# Patient Record
Sex: Female | Born: 1975
Health system: Southern US, Community
[De-identification: ages and names within clinical notes are randomized; demographics above are authoritative.]

## PROBLEM LIST (undated history)

## (undated) DIAGNOSIS — F32A Depression, unspecified: Secondary | ICD-10-CM

## (undated) DIAGNOSIS — M5030 Other cervical disc degeneration, unspecified cervical region: Secondary | ICD-10-CM

## (undated) DIAGNOSIS — I10 Essential (primary) hypertension: Secondary | ICD-10-CM

## (undated) DIAGNOSIS — R2681 Unsteadiness on feet: Secondary | ICD-10-CM

## (undated) DIAGNOSIS — R42 Dizziness and giddiness: Secondary | ICD-10-CM

## (undated) DIAGNOSIS — F329 Major depressive disorder, single episode, unspecified: Secondary | ICD-10-CM

## (undated) DIAGNOSIS — R519 Headache, unspecified: Secondary | ICD-10-CM

## (undated) DIAGNOSIS — H5500 Unspecified nystagmus: Secondary | ICD-10-CM

## (undated) DIAGNOSIS — F419 Anxiety disorder, unspecified: Secondary | ICD-10-CM

## (undated) HISTORY — DX: Other cervical disc degeneration, unspecified cervical region: M50.30

## (undated) HISTORY — DX: Unsteadiness on feet: R26.81

## (undated) HISTORY — DX: Unspecified nystagmus: H55.00

## (undated) HISTORY — DX: Dizziness and giddiness: R42

## (undated) HISTORY — DX: Anxiety disorder, unspecified: F41.9

## (undated) HISTORY — DX: Essential (primary) hypertension: I10

## (undated) HISTORY — DX: Headache, unspecified: R51.9

## (undated) HISTORY — DX: Depression, unspecified: F32.A

---
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
## 2000-07-14 ENCOUNTER — Other Ambulatory Visit: Admission: RE | Admit: 2000-07-14 | Discharge: 2000-07-14 | Payer: Self-pay | Admitting: *Deleted

## 2001-09-07 ENCOUNTER — Other Ambulatory Visit: Admission: RE | Admit: 2001-09-07 | Discharge: 2001-09-07 | Payer: Self-pay | Admitting: Obstetrics & Gynecology

## 2002-09-20 ENCOUNTER — Other Ambulatory Visit: Admission: RE | Admit: 2002-09-20 | Discharge: 2002-09-20 | Payer: Self-pay | Admitting: Obstetrics & Gynecology

## 2015-02-18 LAB — OB RESULTS CONSOLE ANTIBODY SCREEN: Antibody Screen: NEGATIVE

## 2015-02-18 LAB — OB RESULTS CONSOLE ABO/RH: RH TYPE: POSITIVE

## 2015-02-18 LAB — OB RESULTS CONSOLE HEPATITIS B SURFACE ANTIGEN: HEP B S AG: NEGATIVE

## 2015-02-18 LAB — OB RESULTS CONSOLE RUBELLA ANTIBODY, IGM: RUBELLA: IMMUNE

## 2015-02-18 LAB — OB RESULTS CONSOLE HIV ANTIBODY (ROUTINE TESTING): HIV: NONREACTIVE

## 2015-02-18 LAB — OB RESULTS CONSOLE GC/CHLAMYDIA
Chlamydia: NEGATIVE
GC PROBE AMP, GENITAL: NEGATIVE

## 2015-02-18 LAB — OB RESULTS CONSOLE RPR: RPR: NONREACTIVE

## 2015-08-14 LAB — OB RESULTS CONSOLE GBS: GBS: NEGATIVE

## 2015-09-08 ENCOUNTER — Encounter (HOSPITAL_COMMUNITY): Payer: Self-pay | Admitting: *Deleted

## 2015-09-08 ENCOUNTER — Inpatient Hospital Stay (HOSPITAL_COMMUNITY): Payer: BLUE CROSS/BLUE SHIELD | Admitting: Anesthesiology

## 2015-09-08 ENCOUNTER — Inpatient Hospital Stay (HOSPITAL_COMMUNITY)
Admission: AD | Admit: 2015-09-08 | Discharge: 2015-09-09 | DRG: 775 | Disposition: A | Discharge status: Home / Self Care | Payer: BLUE CROSS/BLUE SHIELD | Source: Ambulatory Visit | Attending: Obstetrics and Gynecology | Admitting: Obstetrics and Gynecology

## 2015-09-08 DIAGNOSIS — O2243 Hemorrhoids in pregnancy, third trimester: Secondary | ICD-10-CM | POA: Diagnosis present

## 2015-09-08 DIAGNOSIS — Z2882 Immunization not carried out because of caregiver refusal: Secondary | ICD-10-CM | POA: Diagnosis not present

## 2015-09-08 DIAGNOSIS — Z3A38 38 weeks gestation of pregnancy: Secondary | ICD-10-CM | POA: Diagnosis not present

## 2015-09-08 DIAGNOSIS — Z412 Encounter for routine and ritual male circumcision: Secondary | ICD-10-CM | POA: Diagnosis not present

## 2015-09-08 LAB — TYPE AND SCREEN
ABO/RH(D): A POS
Antibody Screen: NEGATIVE

## 2015-09-08 LAB — CBC
HCT: 38.1 % (ref 36.0–46.0)
HEMOGLOBIN: 13.1 g/dL (ref 12.0–15.0)
MCH: 32 pg (ref 26.0–34.0)
MCHC: 34.4 g/dL (ref 30.0–36.0)
MCV: 92.9 fL (ref 78.0–100.0)
PLATELETS: 267 10*3/uL (ref 150–400)
RBC: 4.1 MIL/uL (ref 3.87–5.11)
RDW: 16 % — AB (ref 11.5–15.5)
WBC: 14.2 10*3/uL — ABNORMAL HIGH (ref 4.0–10.5)

## 2015-09-08 LAB — ABO/RH: ABO/RH(D): A POS

## 2015-09-08 MED ORDER — TETANUS-DIPHTH-ACELL PERTUSSIS 5-2.5-18.5 LF-MCG/0.5 IM SUSP
0.5000 mL | Freq: Once | INTRAMUSCULAR | Status: DC
Start: 2015-09-09 — End: 2015-09-09

## 2015-09-08 MED ORDER — DIBUCAINE 1 % RE OINT: 1.0000 "application " | TOPICAL_OINTMENT | RECTAL | Status: DC | PRN

## 2015-09-08 MED ORDER — PHENYLEPHRINE 40 MCG/ML (10ML) SYRINGE FOR IV PUSH (FOR BLOOD PRESSURE SUPPORT)
80.0000 ug | PREFILLED_SYRINGE | INTRAVENOUS | Status: DC | PRN
  Filled 2015-09-08: qty 2

## 2015-09-08 MED ORDER — LACTATED RINGERS IV SOLN: 500.0000 mL | INTRAVENOUS | Status: DC | PRN

## 2015-09-08 MED ORDER — LACTATED RINGERS IV SOLN
500.0000 mL | Freq: Once | INTRAVENOUS | Status: AC
  Administered 2015-09-08: 500 mL via INTRAVENOUS

## 2015-09-08 MED ORDER — EPHEDRINE 5 MG/ML INJ
10.0000 mg | INTRAVENOUS | Status: DC | PRN
  Filled 2015-09-08: qty 2

## 2015-09-08 MED ORDER — ACETAMINOPHEN 325 MG PO TABS: 650.0000 mg | ORAL_TABLET | ORAL | Status: DC | PRN

## 2015-09-08 MED ORDER — WITCH HAZEL-GLYCERIN EX PADS: 1.0000 "application " | MEDICATED_PAD | CUTANEOUS | Status: DC | PRN

## 2015-09-08 MED ORDER — OXYTOCIN BOLUS FROM INFUSION: 500.0000 mL | INTRAVENOUS | Status: DC

## 2015-09-08 MED ORDER — SIMETHICONE 80 MG PO CHEW: 80.0000 mg | CHEWABLE_TABLET | ORAL | Status: DC | PRN

## 2015-09-08 MED ORDER — LIDOCAINE HCL (PF) 1 % IJ SOLN
INTRAMUSCULAR | Status: DC | PRN
  Administered 2015-09-08 (×2): 5 mL

## 2015-09-08 MED ORDER — ONDANSETRON HCL 4 MG/2ML IJ SOLN: 4.0000 mg | INTRAMUSCULAR | Status: DC | PRN

## 2015-09-08 MED ORDER — DIPHENHYDRAMINE HCL 25 MG PO CAPS: 25.0000 mg | ORAL_CAPSULE | Freq: Four times a day (QID) | ORAL | Status: DC | PRN

## 2015-09-08 MED ORDER — LACTATED RINGERS IV SOLN: INTRAVENOUS | Status: DC

## 2015-09-08 MED ORDER — IBUPROFEN 600 MG PO TABS
600.0000 mg | ORAL_TABLET | Freq: Four times a day (QID) | ORAL | Status: DC
  Administered 2015-09-08 – 2015-09-09 (×4): 600 mg via ORAL
  Filled 2015-09-08 (×5): qty 1

## 2015-09-08 MED ORDER — MEASLES, MUMPS & RUBELLA VAC ~~LOC~~ INJ: 0.5000 mL | INJECTION | Freq: Once | SUBCUTANEOUS | Status: DC

## 2015-09-08 MED ORDER — MEDROXYPROGESTERONE ACETATE 150 MG/ML IM SUSP: 150.0000 mg | INTRAMUSCULAR | Status: DC | PRN

## 2015-09-08 MED ORDER — OXYTOCIN 10 UNIT/ML IJ SOLN
2.5000 [IU]/h | INTRAVENOUS | Status: DC
  Administered 2015-09-08: 39.96 [IU]/h via INTRAVENOUS
  Filled 2015-09-08: qty 4

## 2015-09-08 MED ORDER — BENZOCAINE-MENTHOL 20-0.5 % EX AERO
1.0000 "application " | INHALATION_SPRAY | CUTANEOUS | Status: DC | PRN
  Administered 2015-09-08: 1 via TOPICAL
  Filled 2015-09-08: qty 56

## 2015-09-08 MED ORDER — ONDANSETRON HCL 4 MG PO TABS: 4.0000 mg | ORAL_TABLET | ORAL | Status: DC | PRN

## 2015-09-08 MED ORDER — OXYCODONE-ACETAMINOPHEN 5-325 MG PO TABS
2.0000 | ORAL_TABLET | ORAL | Status: DC | PRN
Start: 2015-09-08 — End: 2015-09-08

## 2015-09-08 MED ORDER — OXYCODONE-ACETAMINOPHEN 5-325 MG PO TABS: 1.0000 | ORAL_TABLET | ORAL | Status: DC | PRN

## 2015-09-08 MED ORDER — ZOLPIDEM TARTRATE 5 MG PO TABS: 5.0000 mg | ORAL_TABLET | Freq: Every evening | ORAL | Status: DC | PRN

## 2015-09-08 MED ORDER — DIPHENHYDRAMINE HCL 50 MG/ML IJ SOLN
12.5000 mg | INTRAMUSCULAR | Status: DC | PRN
Start: 2015-09-08 — End: 2015-09-08

## 2015-09-08 MED ORDER — CITRIC ACID-SODIUM CITRATE 334-500 MG/5ML PO SOLN: 30.0000 mL | ORAL | Status: DC | PRN

## 2015-09-08 MED ORDER — PHENYLEPHRINE 40 MCG/ML (10ML) SYRINGE FOR IV PUSH (FOR BLOOD PRESSURE SUPPORT)
80.0000 ug | PREFILLED_SYRINGE | INTRAVENOUS | Status: DC | PRN
  Filled 2015-09-08: qty 20
  Filled 2015-09-08: qty 2

## 2015-09-08 MED ORDER — LANOLIN HYDROUS EX OINT: TOPICAL_OINTMENT | CUTANEOUS | Status: DC | PRN

## 2015-09-08 MED ORDER — FLEET ENEMA 7-19 GM/118ML RE ENEM: 1.0000 | ENEMA | RECTAL | Status: DC | PRN

## 2015-09-08 MED ORDER — PRENATAL MULTIVITAMIN CH
1.0000 | ORAL_TABLET | Freq: Every day | ORAL | Status: DC
  Administered 2015-09-08 – 2015-09-09 (×2): 1 via ORAL
  Filled 2015-09-08 (×2): qty 1

## 2015-09-08 MED ORDER — SENNOSIDES-DOCUSATE SODIUM 8.6-50 MG PO TABS
2.0000 | ORAL_TABLET | ORAL | Status: DC
  Administered 2015-09-08: 2 via ORAL
  Filled 2015-09-08: qty 2

## 2015-09-08 MED ORDER — LIDOCAINE HCL (PF) 1 % IJ SOLN
30.0000 mL | INTRAMUSCULAR | Status: DC | PRN
  Filled 2015-09-08: qty 30

## 2015-09-08 MED ORDER — FENTANYL 2.5 MCG/ML BUPIVACAINE 1/10 % EPIDURAL INFUSION (WH - ANES)
14.0000 mL/h | INTRAMUSCULAR | Status: DC | PRN
Start: 2015-09-08 — End: 2015-09-08
  Administered 2015-09-08: 14 mL/h via EPIDURAL
  Filled 2015-09-08: qty 125

## 2015-09-08 MED ORDER — ONDANSETRON HCL 4 MG/2ML IJ SOLN: 4.0000 mg | Freq: Four times a day (QID) | INTRAMUSCULAR | Status: DC | PRN

## 2015-09-08 NOTE — Anesthesia Preprocedure Evaluation (Signed)
Anesthesia Evaluation  Patient identified by MRN, date of birth, ID band Patient awake    Reviewed: Allergy & Precautions, H&P , NPO status , Patient's Chart, lab work & pertinent test results  History of Anesthesia Complications Negative for: history of anesthetic complications  Airway Mallampati: II  TM Distance: >3 FB Neck ROM: full    Dental no notable dental hx. (+) Teeth Intact   Pulmonary neg pulmonary ROS,    Pulmonary exam normal breath sounds clear to auscultation       Cardiovascular negative cardio ROS Normal cardiovascular exam Rhythm:regular Rate:Normal     Neuro/Psych negative neurological ROS  negative psych ROS   GI/Hepatic negative GI ROS, Neg liver ROS,   Endo/Other  negative endocrine ROS  Renal/GU negative Renal ROS  negative genitourinary   Musculoskeletal negative musculoskeletal ROS (+)   Abdominal   Peds negative pediatric ROS (+)  Hematology negative hematology ROS (+)   Anesthesia Other Findings   Reproductive/Obstetrics (+) Pregnancy                             Anesthesia Physical Anesthesia Plan  ASA: II  Anesthesia Plan: Epidural   Post-op Pain Management:    Induction:   Airway Management Planned:   Additional Equipment:   Intra-op Plan:   Post-operative Plan:   Informed Consent: I have reviewed the patients History and Physical, chart, labs and discussed the procedure including the risks, benefits and alternatives for the proposed anesthesia with the patient or authorized representative who has indicated his/her understanding and acceptance.     Plan Discussed with:   Anesthesia Plan Comments:         Anesthesia Quick Evaluation  

## 2015-09-08 NOTE — H&P (Signed)
Erika Duffy is a 40 y.o. female G3P2 @ 38+5 presenting for SOL.  Pregnancy uncomplicated.  History OB History    Gravida Para Term Preterm AB TAB SAB Ectopic Multiple Living   3 2        2      History reviewed. No pertinent past medical history. History reviewed. No pertinent past surgical history. Family History: family history is not on file. Social History:  reports that she has never smoked. She does not have any smokeless tobacco history on file. She reports that she does not drink alcohol or use illicit drugs.   Prenatal Transfer Tool  Maternal Diabetes: No Genetic Screening: Declined Maternal Ultrasounds/Referrals: Normal Fetal Ultrasounds or other Referrals:  None Maternal Substance Abuse:  No Significant Maternal Medications:  None Significant Maternal Lab Results:  None Other Comments:  None  ROS  Dilation: 10 Effacement (%): 100 Station: +2 Exam by:: M.Merrill, RN Blood pressure 167/93, pulse 86, temperature 97.8 F (36.6 C), temperature source Axillary, resp. rate 18, height 5\' 8"  (1.727 m), weight 263 lb (119.296 kg), SpO2 98 %. Exam Physical Exam  Gen - uncomfortable w/ epidural Abd - gravid, NT Ext - no edema Cvx 5.5 cm on admission Prenatal labs: ABO, Rh: --/--/A POS (04/03 0330) Antibody: NEG (04/03 0330) Rubella: Immune (09/13 0000) RPR: Nonreactive (09/13 0000)  HBsAg: Negative (09/13 0000)  HIV: Non-reactive (09/13 0000)  GBS: Negative (03/09 0000)   Assessment/Plan: Admit Epidural prn   Erika Duffy 09/08/2015, 6:37 AM

## 2015-09-08 NOTE — Progress Notes (Signed)
SVD of vigerous female infant w/ apgars of 9,9. Placenta delivered spontaneous w/ 3VC.   2nd degree lac repaired w/ 3-0 vicryl rapide.  Fundus firm.  EBL 100cc .

## 2015-09-08 NOTE — Lactation Note (Signed)
This note was copied from a baby's chart. Lactation Consultation Note  P3, Mother breastfed both children for 6 weeks and then pumped for 6 months with 2nd child. Mother would like to only pump and bottle feed this child and supplement w/ formula. Reviewed volume guidelines and spoon feeding with parents. Set up DEBP.  Recommend mother pump every 3 hours with the exception of once during the night. Reviewed cleaning and milk storage. Mother has her own DEBP and asked LC about the brand.  Unable to recommend or not. Suggest she research before opening box.  Provided paperwork for 2 week rental if pump is available. Mother pumped 5 ml which she will give to baby upon wakening via bottle. Suggest she call if further assistance is needed.   Patient Name: Erika Duffy SeMegan Torok UJWJX'BToday's Date: 09/08/2015 Reason for consult: Initial assessment   Maternal Data Has patient been taught Hand Expression?: Yes Does the patient have breastfeeding experience prior to this delivery?: Yes  Feeding    LATCH Score/Interventions                      Lactation Tools Discussed/Used Pump Review: Setup, frequency, and cleaning;Milk Storage Initiated by:: Dahlia Byesuth Berkelhammer Date initiated:: 09/08/15   Consult Status Consult Status: PRN Date: 09/09/15 Follow-up type: In-patient    Dahlia ByesBerkelhammer, [facility] 09/08/2015, 1:32 PM

## 2015-09-08 NOTE — MAU Note (Signed)
Pt reports contractions, ROM at 0100.

## 2015-09-08 NOTE — Anesthesia Procedure Notes (Signed)
Epidural Patient location during procedure: OB  Staffing Anesthesiologist: Phillips GroutARIGNAN, Ladelle Teodoro Performed by: anesthesiologist   Preanesthetic Checklist Completed: patient identified, site marked, surgical consent, pre-op evaluation, timeout performed, IV checked, risks and benefits discussed and monitors and equipment checked  Epidural Patient position: sitting Prep: DuraPrep Patient monitoring: heart rate, continuous pulse ox and blood pressure Approach: midline Location: L3-L4 Injection technique: LOR saline  Needle:  Needle type: Tuohy  Needle gauge: 17 G Needle length: 9 cm and 9 Needle insertion depth: 9 cm Catheter type: closed end flexible Catheter size: 20 Guage Catheter at skin depth: 13 cm Test dose: negative  Assessment Events: blood not aspirated, injection not painful, no injection resistance, negative IV test and no paresthesia  Additional Notes Patient identified. Risks/Benefits/Options discussed with patient including but not limited to bleeding, infection, nerve damage, paralysis, failed block, incomplete pain control, headache, blood pressure changes, nausea, vomiting, reactions to medication both or allergic, itching and postpartum back pain. Confirmed with bedside nurse the patient's most recent platelet count. Confirmed with patient that they are not currently taking any anticoagulation, have any bleeding history or any family history of bleeding disorders. Patient expressed understanding and wished to proceed. All questions were answered. Sterile technique was used throughout the entire procedure. Please see nursing notes for vital signs. Test dose was given through epidural needle and negative prior to continuing to dose epidural or start infusion. Warning signs of high block given to the patient including shortness of breath, tingling/numbness in hands, complete motor block, or any concerning symptoms with instructions to call for help. Patient was given  instructions on fall risk and not to get out of bed. All questions and concerns addressed with instructions to call with any issues.

## 2015-09-09 LAB — CBC
HEMATOCRIT: 33.7 % — AB (ref 36.0–46.0)
Hemoglobin: 11.2 g/dL — ABNORMAL LOW (ref 12.0–15.0)
MCH: 31.2 pg (ref 26.0–34.0)
MCHC: 33.2 g/dL (ref 30.0–36.0)
MCV: 93.9 fL (ref 78.0–100.0)
Platelets: 225 10*3/uL (ref 150–400)
RBC: 3.59 MIL/uL — ABNORMAL LOW (ref 3.87–5.11)
RDW: 16.5 % — AB (ref 11.5–15.5)
WBC: 12.6 10*3/uL — AB (ref 4.0–10.5)

## 2015-09-09 LAB — COMPREHENSIVE METABOLIC PANEL
ALT: 16 U/L (ref 14–54)
ANION GAP: 8 (ref 5–15)
AST: 25 U/L (ref 15–41)
Albumin: 2.5 g/dL — ABNORMAL LOW (ref 3.5–5.0)
Alkaline Phosphatase: 74 U/L (ref 38–126)
BILIRUBIN TOTAL: 0.2 mg/dL — AB (ref 0.3–1.2)
BUN: 17 mg/dL (ref 6–20)
CHLORIDE: 108 mmol/L (ref 101–111)
CO2: 24 mmol/L (ref 22–32)
Calcium: 8.6 mg/dL — ABNORMAL LOW (ref 8.9–10.3)
Creatinine, Ser: 0.82 mg/dL (ref 0.44–1.00)
Glucose, Bld: 87 mg/dL (ref 65–99)
POTASSIUM: 4.2 mmol/L (ref 3.5–5.1)
Sodium: 140 mmol/L (ref 135–145)
TOTAL PROTEIN: 5.7 g/dL — AB (ref 6.5–8.1)

## 2015-09-09 LAB — RPR: RPR: NONREACTIVE

## 2015-09-09 MED ORDER — IBUPROFEN 600 MG PO TABS: 600.0000 mg | ORAL_TABLET | Freq: Four times a day (QID) | ORAL | Status: DC

## 2015-09-09 NOTE — Progress Notes (Signed)
Post Partum Day 1 Subjective: no complaints, up ad lib, voiding, tolerating PO and + flatus  Objective: Blood pressure 132/76, pulse 86, temperature 97.8 F (36.6 C), temperature source Oral, resp. rate 20, height 5\' 8"  (1.727 m), weight 263 lb (119.296 kg), SpO2 99 %, unknown if currently breastfeeding.  Physical Exam:  General: alert and cooperative Lochia: appropriate Uterine Fundus: firm Incision: healing well, small non thrombosed hemorrhoids noted DVT Evaluation: No evidence of DVT seen on physical exam. Negative Homan's sign. No cords or calf tenderness. No significant calf/ankle edema.   Recent Labs  09/08/15 0330 09/09/15 0512  HGB 13.1 11.2*  HCT 38.1 33.7*    Assessment/Plan: Discharge home and Circumcision prior to discharge   LOS: 1 day   Laken Rog G 09/09/2015, 8:28 AM

## 2015-09-09 NOTE — Discharge Summary (Signed)
Obstetric Discharge Summary Reason for Admission: onset of labor Prenatal Procedures: ultrasound Intrapartum Procedures: spontaneous vaginal delivery Postpartum Procedures: none Complications-Operative and Postpartum: 2 degree perineal laceration HEMOGLOBIN  Date Value Ref Range Status  09/09/2015 11.2* 12.0 - 15.0 Duffy/dL Final   HCT  Date Value Ref Range Status  09/09/2015 33.7* 36.0 - 46.0 % Final    Physical Exam:  General: alert and cooperative Lochia: appropriate Uterine Fundus: firm Incision: healing well DVT Evaluation: No evidence of DVT seen on physical exam. Negative Homan's sign. No cords or calf tenderness. No significant calf/ankle edema.  Discharge Diagnoses: Term Pregnancy-delivered  Discharge Information: Date: 09/09/2015 Activity: pelvic rest Diet: routine Medications: PNV, Ibuprofen and zoloft Condition: stable Instructions: refer to practice specific booklet Discharge to: home   Newborn Data: Live born female  Birth Weight: 7 lb 13.2 oz (3550 Duffy) APGAR: 9, 9  Home with mother.  Erika Duffy 09/09/2015, 8:37 AM

## 2015-09-09 NOTE — Lactation Note (Signed)
This note was copied from a baby's chart. Lactation Consultation Note  Patient Name: Boy Concha SeMegan Castoro AVWUJ'WToday's Date: 09/09/2015 Reason for consult: Follow-up assessment   Follow up with mom of 26 hour old infant. Mom plans to pump and bottle feed EBM and supplement with formula. Infant with 10 bottle feeds of Formula of 8-40 cc, 5 EBM via bottle of 5 cc. Mom is pumping every 3 hours and supplementing infant. Infant with 6 voids and 3 stools in last 24 hours.  Reviewed BF information in Taking Care of Baby and Me Booklet. Engorgement prevention/treatment reviewed. Mom without c/o engorgement a this time.  I/O reviewed with mom, mom maintaining feeding log and enc to take to Ped visit. Infant with f/u ped appt tomorrow.   Reviewed LC Brochure, Mom aware of OP Services, BF Support Groups and LC Phone #. Enc mom to call with questions/concerns prn.   Maternal Data Formula Feeding for Exclusion: Yes  Feeding Feeding Type: Formula Nipple Type: Slow - flow  LATCH Score/Interventions                      Lactation Tools Discussed/Used Pump Review: Setup, frequency, and cleaning;Milk Storage   Consult Status Consult Status: Complete Follow-up type: Call as needed    Ed BlalockSharon S Deaisha Welborn 09/09/2015, 8:52 AM

## 2015-09-09 NOTE — Anesthesia Postprocedure Evaluation (Signed)
Anesthesia Post Note  Patient: Erika Duffy  Procedure(s) Performed: * No procedures listed *  Patient location during evaluation: Mother Baby Anesthesia Type: Epidural Level of consciousness: awake, awake and alert, oriented and patient cooperative Pain management: pain level controlled Vital Signs Assessment: post-procedure vital signs reviewed and stable Respiratory status: spontaneous breathing Cardiovascular status: stable Postop Assessment: no headache, no backache, epidural receding, no signs of nausea or vomiting and adequate PO intake Anesthetic complications: no    Last Vitals:  Filed Vitals:   09/08/15 2115 09/09/15 0540  BP: 146/86 132/76  Pulse: 91 86  Temp: 36.9 C 36.6 C  Resp: 20 20    Last Pain:  Filed Vitals:   09/09/15 0554  PainSc: 1                  Evan Osburn C

## 2015-09-12 ENCOUNTER — Inpatient Hospital Stay (HOSPITAL_COMMUNITY): Admission: RE | Admit: 2015-09-12 | Payer: Self-pay | Source: Ambulatory Visit

## 2015-10-17 DIAGNOSIS — Z1389 Encounter for screening for other disorder: Secondary | ICD-10-CM | POA: Diagnosis not present

## 2016-03-29 DIAGNOSIS — Z23 Encounter for immunization: Secondary | ICD-10-CM | POA: Diagnosis not present

## 2016-07-29 DIAGNOSIS — L719 Rosacea, unspecified: Secondary | ICD-10-CM | POA: Diagnosis not present

## 2016-07-29 DIAGNOSIS — L858 Other specified epidermal thickening: Secondary | ICD-10-CM | POA: Diagnosis not present

## 2016-08-19 DIAGNOSIS — I10 Essential (primary) hypertension: Secondary | ICD-10-CM | POA: Diagnosis not present

## 2016-08-19 DIAGNOSIS — F325 Major depressive disorder, single episode, in full remission: Secondary | ICD-10-CM | POA: Diagnosis not present

## 2016-08-19 DIAGNOSIS — F419 Anxiety disorder, unspecified: Secondary | ICD-10-CM | POA: Diagnosis not present

## 2016-12-23 DIAGNOSIS — L858 Other specified epidermal thickening: Secondary | ICD-10-CM | POA: Diagnosis not present

## 2016-12-23 DIAGNOSIS — L700 Acne vulgaris: Secondary | ICD-10-CM | POA: Diagnosis not present

## 2017-01-27 DIAGNOSIS — L858 Other specified epidermal thickening: Secondary | ICD-10-CM | POA: Diagnosis not present

## 2017-02-24 DIAGNOSIS — L700 Acne vulgaris: Secondary | ICD-10-CM | POA: Diagnosis not present

## 2017-02-24 DIAGNOSIS — Z79899 Other long term (current) drug therapy: Secondary | ICD-10-CM | POA: Diagnosis not present

## 2017-03-29 DIAGNOSIS — L858 Other specified epidermal thickening: Secondary | ICD-10-CM | POA: Diagnosis not present

## 2017-03-29 DIAGNOSIS — L700 Acne vulgaris: Secondary | ICD-10-CM | POA: Diagnosis not present

## 2017-03-29 DIAGNOSIS — Z79899 Other long term (current) drug therapy: Secondary | ICD-10-CM | POA: Diagnosis not present

## 2017-03-30 DIAGNOSIS — Z23 Encounter for immunization: Secondary | ICD-10-CM | POA: Diagnosis not present

## 2017-05-03 DIAGNOSIS — L700 Acne vulgaris: Secondary | ICD-10-CM | POA: Diagnosis not present

## 2017-05-03 DIAGNOSIS — L858 Other specified epidermal thickening: Secondary | ICD-10-CM | POA: Diagnosis not present

## 2017-05-03 DIAGNOSIS — Z79899 Other long term (current) drug therapy: Secondary | ICD-10-CM | POA: Diagnosis not present

## 2017-06-06 DIAGNOSIS — L700 Acne vulgaris: Secondary | ICD-10-CM | POA: Diagnosis not present

## 2017-06-06 DIAGNOSIS — Z79899 Other long term (current) drug therapy: Secondary | ICD-10-CM | POA: Diagnosis not present

## 2017-06-09 DIAGNOSIS — L858 Other specified epidermal thickening: Secondary | ICD-10-CM | POA: Diagnosis not present

## 2017-06-09 DIAGNOSIS — L738 Other specified follicular disorders: Secondary | ICD-10-CM | POA: Diagnosis not present

## 2017-07-07 DIAGNOSIS — Z79899 Other long term (current) drug therapy: Secondary | ICD-10-CM | POA: Diagnosis not present

## 2017-07-07 DIAGNOSIS — L700 Acne vulgaris: Secondary | ICD-10-CM | POA: Diagnosis not present

## 2017-07-07 DIAGNOSIS — L858 Other specified epidermal thickening: Secondary | ICD-10-CM | POA: Diagnosis not present

## 2017-08-04 DIAGNOSIS — L858 Other specified epidermal thickening: Secondary | ICD-10-CM | POA: Diagnosis not present

## 2017-08-18 DIAGNOSIS — M722 Plantar fascial fibromatosis: Secondary | ICD-10-CM | POA: Diagnosis not present

## 2017-08-18 DIAGNOSIS — S99922A Unspecified injury of left foot, initial encounter: Secondary | ICD-10-CM | POA: Diagnosis not present

## 2017-08-18 DIAGNOSIS — M6701 Short Achilles tendon (acquired), right ankle: Secondary | ICD-10-CM | POA: Diagnosis not present

## 2017-09-22 DIAGNOSIS — N951 Menopausal and female climacteric states: Secondary | ICD-10-CM | POA: Diagnosis not present

## 2017-09-22 DIAGNOSIS — R61 Generalized hyperhidrosis: Secondary | ICD-10-CM | POA: Diagnosis not present

## 2017-10-13 DIAGNOSIS — R232 Flushing: Secondary | ICD-10-CM | POA: Diagnosis not present

## 2017-10-13 DIAGNOSIS — F419 Anxiety disorder, unspecified: Secondary | ICD-10-CM | POA: Diagnosis not present

## 2017-10-13 DIAGNOSIS — I10 Essential (primary) hypertension: Secondary | ICD-10-CM | POA: Diagnosis not present

## 2017-10-13 DIAGNOSIS — F325 Major depressive disorder, single episode, in full remission: Secondary | ICD-10-CM | POA: Diagnosis not present

## 2017-11-10 DIAGNOSIS — L700 Acne vulgaris: Secondary | ICD-10-CM | POA: Diagnosis not present

## 2017-11-10 DIAGNOSIS — Z79899 Other long term (current) drug therapy: Secondary | ICD-10-CM | POA: Diagnosis not present

## 2017-11-17 DIAGNOSIS — Z6835 Body mass index (BMI) 35.0-35.9, adult: Secondary | ICD-10-CM | POA: Diagnosis not present

## 2017-11-17 DIAGNOSIS — Z01419 Encounter for gynecological examination (general) (routine) without abnormal findings: Secondary | ICD-10-CM | POA: Diagnosis not present

## 2017-11-17 DIAGNOSIS — Z1212 Encounter for screening for malignant neoplasm of rectum: Secondary | ICD-10-CM | POA: Diagnosis not present

## 2017-11-17 DIAGNOSIS — Z800 Family history of malignant neoplasm of digestive organs: Secondary | ICD-10-CM | POA: Diagnosis not present

## 2017-11-17 DIAGNOSIS — N816 Rectocele: Secondary | ICD-10-CM | POA: Diagnosis not present

## 2017-11-17 DIAGNOSIS — Z8042 Family history of malignant neoplasm of prostate: Secondary | ICD-10-CM | POA: Diagnosis not present

## 2017-11-17 DIAGNOSIS — Z803 Family history of malignant neoplasm of breast: Secondary | ICD-10-CM | POA: Diagnosis not present

## 2017-12-01 DIAGNOSIS — N8110 Cystocele, unspecified: Secondary | ICD-10-CM | POA: Diagnosis not present

## 2017-12-01 DIAGNOSIS — M6281 Muscle weakness (generalized): Secondary | ICD-10-CM | POA: Diagnosis not present

## 2017-12-01 DIAGNOSIS — N816 Rectocele: Secondary | ICD-10-CM | POA: Diagnosis not present

## 2017-12-29 DIAGNOSIS — Z809 Family history of malignant neoplasm, unspecified: Secondary | ICD-10-CM | POA: Diagnosis not present

## 2017-12-29 DIAGNOSIS — Z1231 Encounter for screening mammogram for malignant neoplasm of breast: Secondary | ICD-10-CM | POA: Diagnosis not present

## 2018-01-19 DIAGNOSIS — N819 Female genital prolapse, unspecified: Secondary | ICD-10-CM | POA: Diagnosis not present

## 2018-01-19 DIAGNOSIS — M6281 Muscle weakness (generalized): Secondary | ICD-10-CM | POA: Diagnosis not present

## 2018-01-31 DIAGNOSIS — N8110 Cystocele, unspecified: Secondary | ICD-10-CM | POA: Diagnosis not present

## 2018-01-31 DIAGNOSIS — N393 Stress incontinence (female) (male): Secondary | ICD-10-CM | POA: Diagnosis not present

## 2018-01-31 DIAGNOSIS — N816 Rectocele: Secondary | ICD-10-CM | POA: Diagnosis not present

## 2018-01-31 DIAGNOSIS — M6281 Muscle weakness (generalized): Secondary | ICD-10-CM | POA: Diagnosis not present

## 2018-02-21 DIAGNOSIS — N8110 Cystocele, unspecified: Secondary | ICD-10-CM | POA: Diagnosis not present

## 2018-02-21 DIAGNOSIS — M6281 Muscle weakness (generalized): Secondary | ICD-10-CM | POA: Diagnosis not present

## 2018-02-21 DIAGNOSIS — N816 Rectocele: Secondary | ICD-10-CM | POA: Diagnosis not present

## 2018-03-14 DIAGNOSIS — N8110 Cystocele, unspecified: Secondary | ICD-10-CM | POA: Diagnosis not present

## 2018-03-14 DIAGNOSIS — N816 Rectocele: Secondary | ICD-10-CM | POA: Diagnosis not present

## 2018-03-14 DIAGNOSIS — M6281 Muscle weakness (generalized): Secondary | ICD-10-CM | POA: Diagnosis not present

## 2018-03-24 DIAGNOSIS — Z23 Encounter for immunization: Secondary | ICD-10-CM | POA: Diagnosis not present

## 2018-03-28 DIAGNOSIS — E669 Obesity, unspecified: Secondary | ICD-10-CM | POA: Diagnosis not present

## 2018-03-28 DIAGNOSIS — Z6834 Body mass index (BMI) 34.0-34.9, adult: Secondary | ICD-10-CM | POA: Diagnosis not present

## 2018-03-28 DIAGNOSIS — R5383 Other fatigue: Secondary | ICD-10-CM | POA: Diagnosis not present

## 2018-04-04 DIAGNOSIS — M6281 Muscle weakness (generalized): Secondary | ICD-10-CM | POA: Diagnosis not present

## 2018-04-04 DIAGNOSIS — N816 Rectocele: Secondary | ICD-10-CM | POA: Diagnosis not present

## 2018-04-04 DIAGNOSIS — N8110 Cystocele, unspecified: Secondary | ICD-10-CM | POA: Diagnosis not present

## 2018-04-18 DIAGNOSIS — M6281 Muscle weakness (generalized): Secondary | ICD-10-CM | POA: Diagnosis not present

## 2018-04-18 DIAGNOSIS — N819 Female genital prolapse, unspecified: Secondary | ICD-10-CM | POA: Diagnosis not present

## 2018-04-25 DIAGNOSIS — E669 Obesity, unspecified: Secondary | ICD-10-CM | POA: Diagnosis not present

## 2018-04-25 DIAGNOSIS — I10 Essential (primary) hypertension: Secondary | ICD-10-CM | POA: Diagnosis not present

## 2018-04-25 DIAGNOSIS — Z6833 Body mass index (BMI) 33.0-33.9, adult: Secondary | ICD-10-CM | POA: Diagnosis not present

## 2018-05-02 DIAGNOSIS — N816 Rectocele: Secondary | ICD-10-CM | POA: Diagnosis not present

## 2018-05-02 DIAGNOSIS — M6281 Muscle weakness (generalized): Secondary | ICD-10-CM | POA: Diagnosis not present

## 2018-05-02 DIAGNOSIS — N8110 Cystocele, unspecified: Secondary | ICD-10-CM | POA: Diagnosis not present

## 2018-06-13 DIAGNOSIS — N951 Menopausal and female climacteric states: Secondary | ICD-10-CM | POA: Diagnosis not present

## 2018-06-13 DIAGNOSIS — E669 Obesity, unspecified: Secondary | ICD-10-CM | POA: Diagnosis not present

## 2018-06-13 DIAGNOSIS — Z6832 Body mass index (BMI) 32.0-32.9, adult: Secondary | ICD-10-CM | POA: Diagnosis not present

## 2018-06-13 DIAGNOSIS — I10 Essential (primary) hypertension: Secondary | ICD-10-CM | POA: Diagnosis not present

## 2018-10-04 DIAGNOSIS — Z6831 Body mass index (BMI) 31.0-31.9, adult: Secondary | ICD-10-CM | POA: Diagnosis not present

## 2018-10-04 DIAGNOSIS — H5500 Unspecified nystagmus: Secondary | ICD-10-CM | POA: Diagnosis not present

## 2018-10-04 DIAGNOSIS — R42 Dizziness and giddiness: Secondary | ICD-10-CM | POA: Diagnosis not present

## 2018-10-04 DIAGNOSIS — I10 Essential (primary) hypertension: Secondary | ICD-10-CM | POA: Diagnosis not present

## 2018-10-09 DIAGNOSIS — R42 Dizziness and giddiness: Secondary | ICD-10-CM | POA: Diagnosis not present

## 2018-10-09 DIAGNOSIS — Z6831 Body mass index (BMI) 31.0-31.9, adult: Secondary | ICD-10-CM | POA: Diagnosis not present

## 2018-10-09 DIAGNOSIS — H5500 Unspecified nystagmus: Secondary | ICD-10-CM | POA: Diagnosis not present

## 2018-10-09 DIAGNOSIS — M4802 Spinal stenosis, cervical region: Secondary | ICD-10-CM | POA: Diagnosis not present

## 2018-10-09 DIAGNOSIS — M545 Low back pain: Secondary | ICD-10-CM | POA: Diagnosis not present

## 2018-10-09 DIAGNOSIS — M542 Cervicalgia: Secondary | ICD-10-CM | POA: Diagnosis not present

## 2018-10-09 DIAGNOSIS — M549 Dorsalgia, unspecified: Secondary | ICD-10-CM | POA: Diagnosis not present

## 2018-10-09 DIAGNOSIS — M50323 Other cervical disc degeneration at C6-C7 level: Secondary | ICD-10-CM | POA: Diagnosis not present

## 2018-10-11 DIAGNOSIS — R42 Dizziness and giddiness: Secondary | ICD-10-CM | POA: Diagnosis not present

## 2018-10-11 DIAGNOSIS — H539 Unspecified visual disturbance: Secondary | ICD-10-CM | POA: Diagnosis not present

## 2018-10-17 DIAGNOSIS — Z711 Person with feared health complaint in whom no diagnosis is made: Secondary | ICD-10-CM | POA: Diagnosis not present

## 2018-10-17 DIAGNOSIS — R42 Dizziness and giddiness: Secondary | ICD-10-CM | POA: Diagnosis not present

## 2018-10-18 ENCOUNTER — Encounter: Payer: Self-pay | Admitting: *Deleted

## 2018-10-18 ENCOUNTER — Telehealth: Payer: Self-pay | Admitting: *Deleted

## 2018-10-18 NOTE — Telephone Encounter (Signed)
LVM requesting call back to update EMR.  

## 2018-10-18 NOTE — Telephone Encounter (Signed)
Spoke with patient and updated EMR. 

## 2018-10-18 NOTE — Telephone Encounter (Signed)
Pt returned call

## 2018-10-18 NOTE — Addendum Note (Signed)
Addended by: [place] Pink on: 10/18/2018 01:26 PM   Modules accepted: Orders

## 2018-10-19 ENCOUNTER — Encounter: Payer: Self-pay | Admitting: Diagnostic Neuroimaging

## 2018-10-19 ENCOUNTER — Other Ambulatory Visit: Payer: Self-pay

## 2018-10-19 ENCOUNTER — Ambulatory Visit (INDEPENDENT_AMBULATORY_CARE_PROVIDER_SITE_OTHER): Payer: BLUE CROSS/BLUE SHIELD | Admitting: Diagnostic Neuroimaging

## 2018-10-19 DIAGNOSIS — R200 Anesthesia of skin: Secondary | ICD-10-CM | POA: Diagnosis not present

## 2018-10-19 DIAGNOSIS — R2681 Unsteadiness on feet: Secondary | ICD-10-CM | POA: Diagnosis not present

## 2018-10-19 DIAGNOSIS — R42 Dizziness and giddiness: Secondary | ICD-10-CM | POA: Diagnosis not present

## 2018-10-19 NOTE — Progress Notes (Signed)
[facility]  PATIENT: Erika Duffy DOB: 11-19-1975  REFERRING CLINICIAN: C Prochnau HISTORY FROM: patient  REASON FOR VISIT: new consult    HISTORICAL  CHIEF COMPLAINT:  Chief Complaint  Patient presents with  . Numbness  . Dizziness    HISTORY OF PRESENT ILLNESS:   43 year old female here for evaluation of gait and balance difficulty.  09/27/2018 felt dizziness, but the pavement felt uneven, and stumbled.  She also noted difficulty with focusing her vision with sort of trailing after image when she was standing on the computer.  Patient was also having some neck pain.  Patient had evaluation by chiropractor for neck pain adjustment following this.  She also saw PCP ordered MRI of the brain.  Patient has been having some intermittent numbness in toes and low back.  No prodromal infections injuries or trauma.  Patient had MRI of the brain which was unremarkable.    REVIEW OF SYSTEMS: Full 14 system review of systems performed and negative with exception of: As per HPI.  ALLERGIES: No Known Allergies  HOME MEDICATIONS: Outpatient Medications Prior to Visit  Medication Sig Dispense Refill  . clonazePAM (KLONOPIN) 0.5 MG tablet Take 0.5 mg by mouth as needed. Once a day    . cloNIDine (CATAPRES) 0.1 MG tablet Take 0.1 mg by mouth 2 (two) times daily.    Marland Kitchen gabapentin (NEURONTIN) 300 MG capsule Take 300 mg by mouth 3 (three) times daily. Take 2-4 caps at bedtime    . phentermine (ADIPEX-P) 37.5 MG tablet Take 37.5 mg by mouth daily before breakfast.    . progesterone (PROMETRIUM) 100 MG capsule Take 100 mg by mouth daily.    . Omega-3 Fatty Acids (FISH OIL PO) Take 1 capsule by mouth daily.    . sertraline (ZOLOFT) 50 MG tablet Take 75 mg by mouth daily.  6  . ibuprofen (ADVIL,MOTRIN) 600 MG tablet Take 1 tablet (600 mg total) by mouth every 6 (six) hours. 30 tablet 1  . Prenatal Vit-Fe Fumarate-FA (MULTIVITAMIN-PRENATAL) 27-0.8 MG TABS tablet Take 1  tablet by mouth daily at 12 noon.     No facility-administered medications prior to visit.     PAST MEDICAL HISTORY: Past Medical History:  Diagnosis Date  . Anxiety   . DDD (degenerative disc disease), cervical   . Depression   . Dizziness   . Gait instability   . Headache   . Hypertension   . Nystagmus     PAST SURGICAL HISTORY: No past surgical history on file.  FAMILY HISTORY: Family History  Problem Relation Age of Onset  . Diabetes Mother   . Hypertension Mother   . Diabetes Father   . Hypertension Father   . Prostate cancer Father   . Healthy Sister   . Breast cancer Maternal Grandmother   . Prostate cancer Paternal Grandfather     SOCIAL HISTORY: Social History   Socioeconomic History  . Marital status: Married    Spouse name: Not on file  . Number of children: 3  . Years of education: Not on file  . 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 Financial resource strain: Not on file  . Food insecurity:    Worry: Not on file    Inability: Not on file  . Transportation needs:    Medical: Not on file    Non-medical: Not on file  Tobacco Use  .  Smoking status: Never Smoker  . Smokeless tobacco: Never Used  Substance and Sexual Activity  . Alcohol use: No  . Drug use: No  . Sexual activity: Not on file  Lifestyle  . Physical activity:    Days per week: Not on file    Minutes per session: Not on file  . Stress: Not on file  Relationships  . Social connections:    Talks on phone: Not on file    Gets together: Not on file    Attends religious service: Not on file    Active member of club or organization: Not on file    Attends meetings of clubs or organizations: Not on file    Relationship status: Not on file  . Intimate partner violence:    Fear of current or ex partner: Not on file    Emotionally abused: Not on file    Physically  abused: Not on file    Forced sexual activity: Not on file  Other Topics Concern  . Not on file  Social History Narrative   Lives w/family   Caffeine- coffee, 2 day     PHYSICAL EXAM    VIDEO EXAM  GENERAL EXAM/CONSTITUTIONAL:  Vitals: There were no vitals filed for this visit.  There is no height or weight on file to calculate BMI. Wt Readings from Last 3 Encounters:  09/08/15 263 lb (119.3 kg)     Patient is in no distress; well developed, nourished and groomed; neck is supple   NEUROLOGIC: MENTAL STATUS:  No flowsheet data found.  awake, alert, oriented to person, place and time  recent and remote memory intact  normal attention and concentration  language fluent, comprehension intact, naming intact  fund of knowledge appropriate  CRANIAL NERVE:   2nd, 3rd, 4th, 6th - visual fields full to confrontation, extraocular muscles intact, no nystagmus  5th - facial sensation symmetric  7th - facial strength symmetric  8th - hearing intact  11th - shoulder shrug symmetric  12th - tongue protrusion midline  MOTOR:   NO TREMOR; NO DRIFT IN BUE  SENSORY:   normal and symmetric to light touch  COORDINATION:   fine finger movements normal    DIAGNOSTIC DATA (LABS, IMAGING, TESTING) - I reviewed patient records, labs, notes, testing and imaging myself where available.  Lab Results  Component Value Date   WBC 12.6 (H) 09/09/2015   HGB 11.2 (L) 09/09/2015   HCT 33.7 (L) 09/09/2015   MCV 93.9 09/09/2015   PLT 225 09/09/2015      Component Value Date/Time   NA 140 09/09/2015 0512   K 4.2 09/09/2015 0512   CL 108 09/09/2015 0512   CO2 24 09/09/2015 0512   GLUCOSE 87 09/09/2015 0512   BUN 17 09/09/2015 0512   CREATININE 0.82 09/09/2015 0512   CALCIUM 8.6 (L) 09/09/2015 0512   PROT 5.7 (L) 09/09/2015 0512   ALBUMIN 2.5 (L) 09/09/2015 0512   AST 25 09/09/2015 0512   ALT 16 09/09/2015 0512   ALKPHOS 74 09/09/2015 0512   BILITOT 0.2 (L)  09/09/2015 0512   GFRNONAA >60 09/09/2015 0512   GFRAA >60 09/09/2015 0512   No results found for: CHOL, HDL, LDLCALC, LDLDIRECT, TRIG, CHOLHDL No results found for: ZOXW9UHGBA1C No results found for: VITAMINB12 No results found for: TSH     ASSESSMENT AND PLAN  43 y.o. year old female here with new onset of gait difficulty, visual disturbance, numbness since 09/27/2018.  Neurologic examination and MRI of the  brain are unremarkable.  Could represent peripheral neuropathy, peripheral vestibulopathy or other post viral syndrome.   Dx:  1. Dizziness   2. Numbness     Virtual Visit via Video Note  I connected with Erika Duffy on 10/19/18 at 10:30 AM EDT by a video enabled telemedicine application and verified that I am speaking with the correct person using two identifiers.  Location: Patient: home Provider: office   I discussed the limitations of evaluation and management by telemedicine and the availability of in person appointments. The patient expressed understanding and agreed to proceed.  I discussed the assessment and treatment plan with the patient. The patient was provided an opportunity to ask questions and all were answered. The patient agreed with the plan and demonstrated an understanding of the instructions.   The patient was advised to call back or seek an in-person evaluation if the symptoms worsen or if the condition fails to improve as anticipated.  I provided 45 minutes of non-face-to-face time during this encounter.   PLAN:  DIZZINESS / OFF BALANCE / NUMBNESS (? Neuropathy, GBS, peripheral vestibulopathy) - follow up MRI brain results (normal per patient) - monitor symptoms; consider PT evaluation - may consider MRI cervical spine and EMG/NCS  Return for pending if symptoms worsen or fail to improve.    Suanne Marker, MD 10/19/2018, 10:45 AM Certified in Neurology, Neurophysiology and Neuroimaging  [facility] [address],  [address] [place], [place] [address] [phone]

## 2018-10-23 DIAGNOSIS — R42 Dizziness and giddiness: Secondary | ICD-10-CM | POA: Diagnosis not present

## 2018-10-23 DIAGNOSIS — R202 Paresthesia of skin: Secondary | ICD-10-CM | POA: Diagnosis not present

## 2018-10-23 DIAGNOSIS — M5030 Other cervical disc degeneration, unspecified cervical region: Secondary | ICD-10-CM | POA: Diagnosis not present

## 2018-10-23 DIAGNOSIS — Z6832 Body mass index (BMI) 32.0-32.9, adult: Secondary | ICD-10-CM | POA: Diagnosis not present

## 2018-10-24 DIAGNOSIS — I10 Essential (primary) hypertension: Secondary | ICD-10-CM | POA: Diagnosis not present

## 2018-10-24 DIAGNOSIS — R42 Dizziness and giddiness: Secondary | ICD-10-CM | POA: Diagnosis not present

## 2018-10-24 DIAGNOSIS — H5500 Unspecified nystagmus: Secondary | ICD-10-CM | POA: Diagnosis not present

## 2018-10-24 DIAGNOSIS — R51 Headache: Secondary | ICD-10-CM | POA: Diagnosis not present

## 2018-10-24 DIAGNOSIS — R2681 Unsteadiness on feet: Secondary | ICD-10-CM | POA: Diagnosis not present

## 2018-11-01 ENCOUNTER — Telehealth: Payer: Self-pay | Admitting: *Deleted

## 2018-11-01 DIAGNOSIS — R269 Unspecified abnormalities of gait and mobility: Secondary | ICD-10-CM

## 2018-11-01 DIAGNOSIS — R200 Anesthesia of skin: Secondary | ICD-10-CM

## 2018-11-01 NOTE — Telephone Encounter (Signed)
LVM requesting patient call back to advise if she still needs FU and if so, advised her it must be converted to video visit.

## 2018-11-02 NOTE — Telephone Encounter (Addendum)
Called patient and informed her Dr Marjory Lies ordered MRI cervical spine. I advised she'll get a call to schedule in approximately 5 business days. I then asked where she had MRI brain done; she had it at [facility].  We rescheduled her FU for two weeks form now and I advised the U can be moved after we get MRI results, if needed. She  verbalized understanding, appreciation. I called MRI center at [phone] , LVM for Melissa asking for call back to get report of patient's MRI.

## 2018-11-02 NOTE — Telephone Encounter (Signed)
Will check MRI cervical. -VRP

## 2018-11-02 NOTE — Addendum Note (Signed)
Addended by: Joycelyn Schmid R on: 11/02/2018 02:12 PM   Modules accepted: Orders

## 2018-11-02 NOTE — Telephone Encounter (Addendum)
Received call back from patient regarding FU next Monday. She stated she understood she could come into office for follow up, and she thought it might be good to get face to face visit. She stated her symptoms have improved significantly in the past week, but her PCP wanted to do a cervical spinal MRI. However PCP couldn't get it approved, and they'd have to wait 180 days to reorder since it's denial.  She wants a "baseline scan" done. She stated the "spot on her back and toes" still get numb with exertion, dizziness comes back with exertion. Dizziness and vision issues have gotten progressively better in past two weeks, and she has resumed many activities. However she would like to touch base with Dr Marjory Lies, prefers to come into the office.  She would like him to review the MRI brain from [facility] and discuss whether he feels she needs a MRI c spine. I advised her if he agrees to see her in office, it must be on a Tues afternoon. She stated that is fine. I advised will send to Dr Marjory Lies and call her back with his reply. Patient verbalized understanding, appreciation.

## 2018-11-06 ENCOUNTER — Ambulatory Visit: Payer: BLUE CROSS/BLUE SHIELD | Admitting: Diagnostic Neuroimaging

## 2018-11-06 NOTE — Telephone Encounter (Signed)
Received fax of results from [facility]: MRI head 10/09/18, MRA neck 10/18/18. Reports placed on Dr [employer] desk for review.

## 2018-11-07 NOTE — Telephone Encounter (Signed)
I am not going to be able to get the MRI Cervical approved with the information that I have. I can start the case and you would have to do a peer to peer or you can see her in the office and she have a physical exam.

## 2018-11-07 NOTE — Telephone Encounter (Signed)
May consider in office visit in next 2 weeks. -VRP

## 2018-11-08 NOTE — Telephone Encounter (Signed)
Called patient to discuss rescheduling office visit. Husband stated she wasn't home; he took message and # and will have her call back.

## 2018-11-08 NOTE — Telephone Encounter (Addendum)
Patient returned call, and  I informed her insurance didn't approve MRI cervical spine. Dr Marjory Lies advised she come into office for evaluation per insurance requirements. We rescheduled her FU. I explained in office check in procedure. She verbalized understanding, appreciation.  I notified Irving Burton, MRI coordinator of patient's upcoming in office appt.

## 2018-11-08 NOTE — Addendum Note (Signed)
Addended by: [place] Pink on: 11/08/2018 08:55 AM   Modules accepted: Orders

## 2018-11-14 DIAGNOSIS — L718 Other rosacea: Secondary | ICD-10-CM | POA: Diagnosis not present

## 2018-11-16 ENCOUNTER — Telehealth: Payer: Self-pay | Admitting: Diagnostic Neuroimaging

## 2018-11-16 NOTE — Telephone Encounter (Signed)
Pt is calling in wanting to know if a copy or the film of her MRI of the brain , and her xrays of her spine were received before her appt.

## 2018-11-16 NOTE — Telephone Encounter (Signed)
Called patient and informed her Dr Leta Baptist reviewed those reports from [facility] a few weeks ago. She stated she thought he wanted to actual images; I advised her that he didn't indicate he needed them. However after her in office visit next Tues, if he needs them, we will request them. She verbalized understanding, appreciation.

## 2018-11-20 ENCOUNTER — Ambulatory Visit: Payer: Self-pay | Admitting: Diagnostic Neuroimaging

## 2018-11-21 ENCOUNTER — Ambulatory Visit (INDEPENDENT_AMBULATORY_CARE_PROVIDER_SITE_OTHER): Payer: BC Managed Care – PPO | Admitting: Diagnostic Neuroimaging

## 2018-11-21 ENCOUNTER — Other Ambulatory Visit: Payer: Self-pay

## 2018-11-21 ENCOUNTER — Encounter: Payer: Self-pay | Admitting: Diagnostic Neuroimaging

## 2018-11-21 VITALS — BP 143/95 | HR 81 | Temp 97.3°F | Ht 68.0 in | Wt 209.6 lb

## 2018-11-21 DIAGNOSIS — M6281 Muscle weakness (generalized): Secondary | ICD-10-CM | POA: Diagnosis not present

## 2018-11-21 DIAGNOSIS — G3281 Cerebellar ataxia in diseases classified elsewhere: Secondary | ICD-10-CM | POA: Diagnosis not present

## 2018-11-21 DIAGNOSIS — R200 Anesthesia of skin: Secondary | ICD-10-CM

## 2018-11-21 NOTE — Progress Notes (Signed)
[facility]  PATIENT: Erika Duffy DOB: 08/08/1975  REFERRING CLINICIAN: C Prochnau HISTORY FROM: patient  REASON FOR VISIT: follow up    HISTORICAL  CHIEF COMPLAINT:  Chief Complaint  Patient presents with  . Dizziness    rm 7, FU- MRI auth    HISTORY OF PRESENT ILLNESS:   UPDATE (11/21/18, VRP): Since last visit, doing better. Symptoms are improving in terms of vision; but more intermittent numbness and heaviness in arms and legs; some intermittent right thoracic and right lumbar pain. More anxiety now.  No alleviating or aggravating factors.    PRIOR HPI (10/19/18): 43 year old female here for evaluation of gait and balance difficulty.  09/27/2018 felt dizziness, but the pavement felt uneven, and stumbled.  She also noted difficulty with focusing her vision with sort of trailing after image when she was standing on the computer.  Patient was also having some neck pain.  Patient had evaluation by chiropractor for neck pain adjustment following this.  She also saw PCP ordered MRI of the brain.  Patient has been having some intermittent numbness in toes and low back.  No prodromal infections injuries or trauma.  Patient had MRI of the brain which was unremarkable.    REVIEW OF SYSTEMS: Full 14 system review of systems performed and negative with exception of: as per HPI.   ALLERGIES: No Known Allergies  HOME MEDICATIONS: Outpatient Medications Prior to Visit  Medication Sig Dispense Refill  . Cholecalciferol (VITAMIN D3 PO) Take by mouth.    . clonazePAM (KLONOPIN) 0.5 MG tablet Take 0.5 mg by mouth as needed. Once a day    . cloNIDine (CATAPRES) 0.1 MG tablet Take 0.1 mg by mouth 2 (two) times daily.    Marland Kitchen. gabapentin (NEURONTIN) 300 MG capsule Take 300 mg by mouth 3 (three) times daily. Take 2-4 caps at bedtime    . Magnesium 300 MG CAPS Take 600 mg by mouth daily.    . Multiple Vitamin (MULTIVITAMIN) tablet Take 1 tablet by mouth daily.    . Omega-3  Fatty Acids (FISH OIL PO) Take 1 capsule by mouth daily.    . Phentermine HCl (LOMAIRA) 8 MG TABS Take by mouth.    . progesterone (PROMETRIUM) 100 MG capsule Take 100 mg by mouth daily.    . sertraline (ZOLOFT) 50 MG tablet Take 75 mg by mouth daily.  6  . TURMERIC CURCUMIN PO Take by mouth.    Marland Kitchen. UNABLE TO FIND Med Name: relora     No facility-administered medications prior to visit.     PAST MEDICAL HISTORY: Past Medical History:  Diagnosis Date  . Anxiety   . DDD (degenerative disc disease), cervical   . Depression   . Dizziness   . Gait instability   . Headache   . Hypertension   . Nystagmus     PAST SURGICAL HISTORY: No past surgical history on file.  FAMILY HISTORY: Family History  Problem Relation Age of Onset  . Diabetes Mother   . Hypertension Mother   . Diabetes Father   . Hypertension Father   . Prostate cancer Father   . Healthy Sister   . Breast cancer Maternal Grandmother   . Prostate cancer Paternal Grandfather     SOCIAL HISTORY: Social History   Socioeconomic History  . Marital status: Married    Spouse name: Not on file  . Number of children: 3  . Years of education: Not on file  . 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 Financial resource strain: Not on file  . Food insecurity    Worry: Not on file    Inability: Not on file  . Transportation needs    Medical: Not on file    Non-medical: Not on file  Tobacco Use  . Smoking status: Never Smoker  . Smokeless tobacco: Never Used  Substance and Sexual Activity  . Alcohol use: No  . Drug use: No  . Sexual activity: Not on file  Lifestyle  . Physical activity    Days per week: Not on file    Minutes per session: Not on file  . Stress: Not on file  Relationships  . Social Musicianconnections    Talks on phone: Not on file    Gets together: Not on file    Attends religious service:  Not on file    Active member of club or organization: Not on file    Attends meetings of clubs or organizations: Not on file    Relationship status: Not on file  . Intimate partner violence    Fear of current or ex partner: Not on file    Emotionally abused: Not on file    Physically abused: Not on file    Forced sexual activity: Not on file  Other Topics Concern  . Not on file  Social History Narrative   Lives w/family   Caffeine- coffee, 2 day     PHYSICAL EXAM    VIDEO EXAM  GENERAL EXAM/CONSTITUTIONAL: Vitals:  Vitals:   11/21/18 1525  BP: (!) 143/95  Pulse: 81  Temp: (!) 97.3 F (36.3 C)  Weight: 209 lb 9.6 oz (95.1 kg)  Height: 5\' 8"  (1.727 m)     Body mass index is 31.87 kg/m. Wt Readings from Last 3 Encounters:  11/21/18 209 lb 9.6 oz (95.1 kg)  09/08/15 263 lb (119.3 kg)     Patient is in no distress; well developed, nourished and groomed; neck is supple  TEARFUL  CARDIOVASCULAR:  Examination of carotid arteries is normal; no carotid bruits  Regular rate and rhythm, no murmurs  Examination of peripheral vascular system by observation and palpation is normal  EYES:  Ophthalmoscopic exam of optic discs and posterior segments is normal; no papilledema or hemorrhages  No exam data present  MUSCULOSKELETAL:  Gait, strength, tone, movements noted in Neurologic exam below  NEUROLOGIC: MENTAL STATUS:  No flowsheet data found.  awake, alert, oriented to person, place and time  recent and remote memory intact  normal attention and concentration  language fluent, comprehension intact, naming intact  fund of knowledge appropriate  CRANIAL NERVE:   2nd - no papilledema on fundoscopic exam  2nd, 3rd, 4th, 6th - pupils equal and reactive to light, visual fields full to confrontation, extraocular muscles intact, no nystagmus  5th - facial sensation symmetric  7th - facial strength symmetric  8th - hearing intact  9th - palate  elevates symmetrically, uvula midline  11th - shoulder shrug symmetric  12th - tongue protrusion midline  MOTOR:   normal bulk and tone, full strength in the BUE, BLE  SENSORY:   normal and symmetric to light touch, pinprick, temperature, vibration  COORDINATION:   finger-nose-finger, fine finger movements normal  REFLEXES:   deep tendon reflexes TRACE and symmetric; ABSENT AT ANKLES  GAIT/STATION:   narrow based gait; romberg is negative  DIAGNOSTIC DATA (LABS, IMAGING, TESTING) - I reviewed patient records, labs, notes, testing and imaging myself where available.  Lab Results  Component Value Date   WBC 12.6 (H) 09/09/2015   HGB 11.2 (L) 09/09/2015   HCT 33.7 (L) 09/09/2015   MCV 93.9 09/09/2015   PLT 225 09/09/2015      Component Value Date/Time   NA 140 09/09/2015 0512   K 4.2 09/09/2015 0512   CL 108 09/09/2015 0512   CO2 24 09/09/2015 0512   GLUCOSE 87 09/09/2015 0512   BUN 17 09/09/2015 0512   CREATININE 0.82 09/09/2015 0512   CALCIUM 8.6 (L) 09/09/2015 0512   PROT 5.7 (L) 09/09/2015 0512   ALBUMIN 2.5 (L) 09/09/2015 0512   AST 25 09/09/2015 0512   ALT 16 09/09/2015 0512   ALKPHOS 74 09/09/2015 0512   BILITOT 0.2 (L) 09/09/2015 0512   GFRNONAA >60 09/09/2015 0512   GFRAA >60 09/09/2015 0512   No results found for: CHOL, HDL, LDLCALC, LDLDIRECT, TRIG, CHOLHDL No results found for: HGBA1C No results found for: VITAMINB12 No results found for: TSH     ASSESSMENT AND PLAN  43 y.o. year old female here with new onset of gait difficulty, visual disturbance, numbness since 09/27/2018.  Neurologic examination and MRI of the brain are unremarkable.  Could represent peripheral neuropathy, peripheral vestibulopathy or other post viral syndrome.   Dx:  1. Numbness   2. Cerebellar ataxia in diseases classified elsewhere ([place])   3. Muscle weakness (generalized)      PLAN:  DIZZINESS / OFF BALANCE / NUMBNESS / WEAKNESS - check MRI  cervical and thoracic spine (with and without)  --> rule out demyelinating disease - check MRI lumbar --> rule out radiculopathy / spinal stenosis  - check EMG/NCS --> neuropathy eval  Orders Placed This Encounter  Procedures  . MR CERVICAL SPINE W WO CONTRAST  . MR THORACIC SPINE W WO CONTRAST  . MR Lumbar Spine W Wo Contrast  . NCV with EMG(electromyography)   Return for for NCV/EMG.    Penni Bombard, MD 9/70/2637, 8:58 PM Certified in Neurology, Neurophysiology and Neuroimaging  [facility] [address] [place], [place] [address] [phone]

## 2018-11-22 NOTE — Telephone Encounter (Signed)
I still was not able to get it approved on my level. I faxed clinical notes to [place] it is pending.

## 2018-11-23 NOTE — Telephone Encounter (Signed)
Scans approved: MRI cervical and thoracic approved. - [id number] auth number  Will hold off on MRI lumbar spine for now.   Penni Bombard, MD 1/60/7371, 0:62 PM Certified in Neurology, Neurophysiology and Neuroimaging  [facility] [address] [place], [place] [address] [phone]

## 2018-11-23 NOTE — Telephone Encounter (Signed)
Noted  

## 2018-11-23 NOTE — Telephone Encounter (Signed)
Noted, thank you

## 2018-11-23 NOTE — Telephone Encounter (Signed)
I called [employer] and spoke to a Geologist, engineering and gave her more information but she was unable to get it approved on her level. The phone number for the peer to peer is [phone]. Tomorrow 11/24/18 is the last day to do the peer to peer or it will become a denial. You don't have to call to schedule it or anything you just need to call when its a good time for you to speak to a [facility] provider. The member ID is [id number] & DOB 1976-04-11.

## 2018-11-27 NOTE — Telephone Encounter (Signed)
no to the covid-19 questions MR Cervical spine w/wo contrast & MR Thoracic spine w/wo contrast Dr. Conley Simmonds Auth: [id number] (exp. 11/22/18 to 05/20/19). Patient is scheduled at [facility] for 11/28/18

## 2018-11-27 NOTE — Telephone Encounter (Signed)
Patient wants to have the MRI Lumbar still done even though she is aware that it has been denied and she is going to pay out of pocket for this exam.

## 2018-11-28 ENCOUNTER — Ambulatory Visit (INDEPENDENT_AMBULATORY_CARE_PROVIDER_SITE_OTHER): Payer: BC Managed Care – PPO

## 2018-11-28 ENCOUNTER — Telehealth: Payer: Self-pay | Admitting: Diagnostic Neuroimaging

## 2018-11-28 ENCOUNTER — Ambulatory Visit: Payer: BC Managed Care – PPO

## 2018-11-28 ENCOUNTER — Other Ambulatory Visit: Payer: Self-pay

## 2018-11-28 DIAGNOSIS — M6281 Muscle weakness (generalized): Secondary | ICD-10-CM

## 2018-11-28 DIAGNOSIS — G3281 Cerebellar ataxia in diseases classified elsewhere: Secondary | ICD-10-CM | POA: Diagnosis not present

## 2018-11-28 DIAGNOSIS — R200 Anesthesia of skin: Secondary | ICD-10-CM

## 2018-11-28 MED ORDER — GADOBENATE DIMEGLUMINE 529 MG/ML IV SOLN
20.0000 mL | Freq: Once | INTRAVENOUS | Status: AC | PRN
  Administered 2018-11-28: 20 mL via INTRAVENOUS

## 2018-11-28 NOTE — Telephone Encounter (Signed)
Patient came in today for MRI Dr. Leta Baptist ordered. Patient states that she had an MRI and MRA done at [facility] in [place]. She states during her virtual visit Dr. Leta Baptist went over written results and saw no abnormalities. However, she states she would like for Dr. Leta Baptist to review images of MRI and MRA as well just for a second opinion of actual images. I got patient to sign medical release form for images to be retrieved for review.

## 2018-11-30 DIAGNOSIS — N809 Endometriosis, unspecified: Secondary | ICD-10-CM | POA: Diagnosis not present

## 2018-11-30 DIAGNOSIS — N959 Unspecified menopausal and perimenopausal disorder: Secondary | ICD-10-CM | POA: Diagnosis not present

## 2018-11-30 DIAGNOSIS — Z01419 Encounter for gynecological examination (general) (routine) without abnormal findings: Secondary | ICD-10-CM | POA: Diagnosis not present

## 2018-11-30 DIAGNOSIS — Z6832 Body mass index (BMI) 32.0-32.9, adult: Secondary | ICD-10-CM | POA: Diagnosis not present

## 2018-12-04 ENCOUNTER — Other Ambulatory Visit: Payer: Self-pay | Admitting: Obstetrics and Gynecology

## 2018-12-04 ENCOUNTER — Telehealth: Payer: Self-pay | Admitting: *Deleted

## 2018-12-04 DIAGNOSIS — Z803 Family history of malignant neoplasm of breast: Secondary | ICD-10-CM

## 2018-12-04 NOTE — Telephone Encounter (Signed)
LVM requesting call back for MRI results. 

## 2018-12-05 NOTE — Telephone Encounter (Signed)
LVM #2 requesting call back for MRI results. 

## 2018-12-06 ENCOUNTER — Encounter: Payer: Self-pay | Admitting: *Deleted

## 2018-12-11 NOTE — Telephone Encounter (Signed)
I called pt that her MR cervical spine shows At C5-6: moderate pinched nerves / biforaminal stenosis. Consider conservative mgmt. Pt wanted to wait until Dr. Kelli Churn does her EMG to discuss PT and OT. She verbalized understanding.

## 2018-12-11 NOTE — Telephone Encounter (Signed)
I called pt that the MRI lumbar spine and thoracic  spine was unremarkable. Continue treatment plan. Pt verbalized understanding.

## 2018-12-11 NOTE — Telephone Encounter (Signed)
Pt has called back for MRI results, please call

## 2018-12-13 NOTE — Telephone Encounter (Signed)
Received fax from [facility]: MRA neck, MRI head reports. Note attached : Imaging on CD mailed 12/13/18.  We received these because the patient wants Dr Leta Baptist to review them personally. Reports placed on his desk; have not received CD yet.

## 2018-12-14 ENCOUNTER — Encounter: Payer: BC Managed Care – PPO | Admitting: Diagnostic Neuroimaging

## 2018-12-14 ENCOUNTER — Ambulatory Visit (INDEPENDENT_AMBULATORY_CARE_PROVIDER_SITE_OTHER): Payer: BC Managed Care – PPO | Admitting: Diagnostic Neuroimaging

## 2018-12-14 ENCOUNTER — Other Ambulatory Visit: Payer: Self-pay

## 2018-12-14 DIAGNOSIS — R200 Anesthesia of skin: Secondary | ICD-10-CM

## 2018-12-14 DIAGNOSIS — M6281 Muscle weakness (generalized): Secondary | ICD-10-CM

## 2018-12-14 DIAGNOSIS — Z0289 Encounter for other administrative examinations: Secondary | ICD-10-CM

## 2018-12-19 NOTE — Telephone Encounter (Signed)
Received CD from [facility]. Placed on Dr [employer] desk. Patient was called on 12/11/18 with reports of MRI results.

## 2019-01-08 NOTE — Procedures (Signed)
[facility]  NCS (NERVE CONDUCTION STUDY) WITH EMG (ELECTROMYOGRAPHY) REPORT   STUDY DATE: 12/13/18 PATIENT NAME: Erika Duffy DOB: 05/12/1976 MRN: [id number]  ORDERING CLINICIAN: Andrey Spearman, MD   TECHNOLOGIST: Sherre Scarlet ELECTROMYOGRAPHER: Earlean Polka. Arryanna Holquin, MD  CLINICAL INFORMATION: 43 year old female with numbness and weakness.  FINDINGS: NERVE CONDUCTION STUDY: Right median, right ulnar, right peroneal and right tibial motor responses are normal.  Right median, right ulnar, right peroneal and right tibial F wave latencies are normal.  Right sural, right superficial peroneal, right median and right ulnar sensory responses are normal.    NEEDLE ELECTROMYOGRAPHY: Needle examination of right vastus medialis, tibialis anterior and gastrocnemius muscles are normal.   IMPRESSION:   This is a normal study.  No electrodiagnostic evidence of large fiber neuropathy or myopathy at this time.    INTERPRETING PHYSICIAN:  Penni Bombard, MD Certified in Neurology, Neurophysiology and Neuroimaging  [facility] [address], [place], [place] [address] [phone]   [facility]    Nerve / Sites Muscle Latency Ref. Amplitude Ref. Rel Amp Segments Distance Velocity Ref. Area    ms ms mV mV %  cm m/s m/s mVms  R Median - APB     Wrist APB 2.8 ?4.4 8.1 ?4.0 100 Wrist - APB 7   30.0     Upper arm APB 6.5  7.7  96.1 Upper arm - Wrist 22 59 ?49 28.6  R Ulnar - ADM     Wrist ADM 2.3 ?3.3 9.1 ?6.0 100 Wrist - ADM 7   29.8     B.Elbow ADM 5.3  8.8  97.1 B.Elbow - Wrist 18 60 ?49 30.4     A.Elbow ADM 7.0  8.1  92.5 A.Elbow - B.Elbow 10 58 ?49 28.8         A.Elbow - Wrist      R Peroneal - EDB     Ankle EDB 4.3 ?6.5 7.8 ?2.0 100 Ankle - EDB 9   24.3     Fib head EDB 10.2  7.7  98.3 Fib head - Ankle 29 49 ?44 25.6     Pop fossa EDB 12.2  6.9  89.7 Pop fossa - Fib head 10 49 ?44 23.2         Pop fossa - Ankle      R Tibial - AH    Ankle AH 3.5 ?5.8 8.0 ?4.0 100 Ankle - AH 9   18.1     Pop fossa AH 12.3  6.8  85.1 Pop fossa - Ankle 37 42 ?41 16.1             SNC    Nerve / Sites Rec. Site Peak Lat Ref.  Amp Ref. Segments Distance    ms ms V V  cm  R Sural - Ankle (Calf)     Calf Ankle 3.1 ?4.4 26 ?6 Calf - Ankle 14  R Superficial peroneal - Ankle     Lat leg Ankle 3.6 ?4.4 9 ?6 Lat leg - Ankle 14  R Median - Orthodromic (Dig II, Mid palm)     Dig II Wrist 2.7 ?3.4 33 ?10 Dig II - Wrist 13  R Ulnar - Orthodromic, (Dig V, Mid palm)     Dig V Wrist 2.4 ?3.1 11 ?5 Dig V - Wrist 40              F  Wave    Nerve F Lat Ref.  ms ms  R Median - APB 25.1 ?31.0  R Ulnar - ADM 26.4 ?32.0  R Peroneal - EDB 44.6 ?56.0  R Tibial - AH 47.9 ?56.0             EMG full       EMG Summary Table    Spontaneous MUAP Recruitment  Muscle IA Fib PSW Fasc Other Amp Dur. Poly Pattern  R. Vastus medialis Normal None None None _______ Normal Normal Normal Normal  R. Tibialis anterior Normal None None None _______ Normal Normal Normal Normal  R. Gastrocnemius (Medial head) Normal None None None _______ Normal Normal Normal Normal

## 2019-02-08 DIAGNOSIS — Z6834 Body mass index (BMI) 34.0-34.9, adult: Secondary | ICD-10-CM | POA: Diagnosis not present

## 2019-02-08 DIAGNOSIS — R42 Dizziness and giddiness: Secondary | ICD-10-CM | POA: Diagnosis not present

## 2019-02-08 DIAGNOSIS — Z1231 Encounter for screening mammogram for malignant neoplasm of breast: Secondary | ICD-10-CM | POA: Diagnosis not present

## 2019-02-08 DIAGNOSIS — F419 Anxiety disorder, unspecified: Secondary | ICD-10-CM | POA: Diagnosis not present

## 2019-02-22 DIAGNOSIS — F411 Generalized anxiety disorder: Secondary | ICD-10-CM | POA: Diagnosis not present

## 2019-02-27 ENCOUNTER — Other Ambulatory Visit: Payer: BLUE CROSS/BLUE SHIELD

## 2019-03-06 ENCOUNTER — Ambulatory Visit
Admission: RE | Admit: 2019-03-06 | Discharge: 2019-03-06 | Disposition: A | Discharge status: Home / Self Care | Payer: BC Managed Care – PPO | Source: Ambulatory Visit | Attending: Obstetrics and Gynecology | Admitting: Obstetrics and Gynecology

## 2019-03-06 ENCOUNTER — Other Ambulatory Visit: Payer: Self-pay

## 2019-03-06 DIAGNOSIS — N6011 Diffuse cystic mastopathy of right breast: Secondary | ICD-10-CM | POA: Diagnosis not present

## 2019-03-06 DIAGNOSIS — Z803 Family history of malignant neoplasm of breast: Secondary | ICD-10-CM

## 2019-03-06 DIAGNOSIS — N6012 Diffuse cystic mastopathy of left breast: Secondary | ICD-10-CM | POA: Diagnosis not present

## 2019-03-06 DIAGNOSIS — F411 Generalized anxiety disorder: Secondary | ICD-10-CM | POA: Diagnosis not present

## 2019-03-06 MED ORDER — GADOBUTROL 1 MMOL/ML IV SOLN
10.0000 mL | Freq: Once | INTRAVENOUS | Status: AC | PRN
  Administered 2019-03-06: 10 mL via INTRAVENOUS

## 2019-03-13 DIAGNOSIS — F411 Generalized anxiety disorder: Secondary | ICD-10-CM | POA: Diagnosis not present

## 2019-03-20 DIAGNOSIS — F411 Generalized anxiety disorder: Secondary | ICD-10-CM | POA: Diagnosis not present

## 2019-03-27 DIAGNOSIS — F411 Generalized anxiety disorder: Secondary | ICD-10-CM | POA: Diagnosis not present

## 2019-04-04 DIAGNOSIS — F411 Generalized anxiety disorder: Secondary | ICD-10-CM | POA: Diagnosis not present

## 2019-04-04 DIAGNOSIS — Z23 Encounter for immunization: Secondary | ICD-10-CM | POA: Diagnosis not present

## 2019-04-10 DIAGNOSIS — F411 Generalized anxiety disorder: Secondary | ICD-10-CM | POA: Diagnosis not present

## 2019-04-24 DIAGNOSIS — F411 Generalized anxiety disorder: Secondary | ICD-10-CM | POA: Diagnosis not present

## 2019-05-01 DIAGNOSIS — F411 Generalized anxiety disorder: Secondary | ICD-10-CM | POA: Diagnosis not present

## 2019-05-09 DIAGNOSIS — F411 Generalized anxiety disorder: Secondary | ICD-10-CM | POA: Diagnosis not present

## 2019-05-22 DIAGNOSIS — F411 Generalized anxiety disorder: Secondary | ICD-10-CM | POA: Diagnosis not present

## 2019-06-08 DIAGNOSIS — R519 Headache, unspecified: Secondary | ICD-10-CM | POA: Diagnosis not present

## 2019-06-08 DIAGNOSIS — Z20828 Contact with and (suspected) exposure to other viral communicable diseases: Secondary | ICD-10-CM | POA: Diagnosis not present

## 2019-06-08 DIAGNOSIS — R05 Cough: Secondary | ICD-10-CM | POA: Diagnosis not present

## 2019-06-14 DIAGNOSIS — R0602 Shortness of breath: Secondary | ICD-10-CM | POA: Diagnosis not present

## 2019-06-26 DIAGNOSIS — F411 Generalized anxiety disorder: Secondary | ICD-10-CM | POA: Diagnosis not present

## 2019-07-03 DIAGNOSIS — F411 Generalized anxiety disorder: Secondary | ICD-10-CM | POA: Diagnosis not present

## 2019-07-10 DIAGNOSIS — F411 Generalized anxiety disorder: Secondary | ICD-10-CM | POA: Diagnosis not present

## 2019-07-24 DIAGNOSIS — F411 Generalized anxiety disorder: Secondary | ICD-10-CM | POA: Diagnosis not present

## 2019-08-01 DIAGNOSIS — F411 Generalized anxiety disorder: Secondary | ICD-10-CM | POA: Diagnosis not present

## 2019-08-10 DIAGNOSIS — F419 Anxiety disorder, unspecified: Secondary | ICD-10-CM | POA: Diagnosis not present

## 2019-08-10 DIAGNOSIS — Z8616 Personal history of COVID-19: Secondary | ICD-10-CM | POA: Diagnosis not present

## 2019-08-10 DIAGNOSIS — R002 Palpitations: Secondary | ICD-10-CM | POA: Diagnosis not present

## 2019-08-10 DIAGNOSIS — R0600 Dyspnea, unspecified: Secondary | ICD-10-CM | POA: Diagnosis not present

## 2019-08-21 DIAGNOSIS — F411 Generalized anxiety disorder: Secondary | ICD-10-CM | POA: Diagnosis not present

## 2019-08-29 DIAGNOSIS — D485 Neoplasm of uncertain behavior of skin: Secondary | ICD-10-CM | POA: Diagnosis not present

## 2019-08-29 DIAGNOSIS — D1801 Hemangioma of skin and subcutaneous tissue: Secondary | ICD-10-CM | POA: Diagnosis not present

## 2019-08-29 DIAGNOSIS — L718 Other rosacea: Secondary | ICD-10-CM | POA: Diagnosis not present

## 2019-08-29 DIAGNOSIS — D225 Melanocytic nevi of trunk: Secondary | ICD-10-CM | POA: Diagnosis not present

## 2019-08-29 DIAGNOSIS — L821 Other seborrheic keratosis: Secondary | ICD-10-CM | POA: Diagnosis not present

## 2019-08-30 DIAGNOSIS — F411 Generalized anxiety disorder: Secondary | ICD-10-CM | POA: Diagnosis not present

## 2019-09-11 DIAGNOSIS — F411 Generalized anxiety disorder: Secondary | ICD-10-CM | POA: Diagnosis not present

## 2019-09-14 DIAGNOSIS — R0600 Dyspnea, unspecified: Secondary | ICD-10-CM | POA: Diagnosis not present

## 2019-09-14 DIAGNOSIS — Z8616 Personal history of COVID-19: Secondary | ICD-10-CM | POA: Diagnosis not present

## 2019-09-18 DIAGNOSIS — F411 Generalized anxiety disorder: Secondary | ICD-10-CM | POA: Diagnosis not present

## 2019-10-02 DIAGNOSIS — F411 Generalized anxiety disorder: Secondary | ICD-10-CM | POA: Diagnosis not present

## 2019-10-09 DIAGNOSIS — F411 Generalized anxiety disorder: Secondary | ICD-10-CM | POA: Diagnosis not present

## 2019-11-29 DIAGNOSIS — M9903 Segmental and somatic dysfunction of lumbar region: Secondary | ICD-10-CM | POA: Diagnosis not present

## 2019-11-29 DIAGNOSIS — M62830 Muscle spasm of back: Secondary | ICD-10-CM | POA: Diagnosis not present

## 2019-11-29 DIAGNOSIS — M9905 Segmental and somatic dysfunction of pelvic region: Secondary | ICD-10-CM | POA: Diagnosis not present

## 2019-11-29 DIAGNOSIS — M545 Low back pain: Secondary | ICD-10-CM | POA: Diagnosis not present

## 2019-12-03 DIAGNOSIS — Z6836 Body mass index (BMI) 36.0-36.9, adult: Secondary | ICD-10-CM | POA: Diagnosis not present

## 2019-12-03 DIAGNOSIS — Z01419 Encounter for gynecological examination (general) (routine) without abnormal findings: Secondary | ICD-10-CM | POA: Diagnosis not present

## 2019-12-03 DIAGNOSIS — N816 Rectocele: Secondary | ICD-10-CM | POA: Diagnosis not present

## 2019-12-20 DIAGNOSIS — I788 Other diseases of capillaries: Secondary | ICD-10-CM | POA: Diagnosis not present

## 2019-12-27 DIAGNOSIS — M545 Low back pain: Secondary | ICD-10-CM | POA: Diagnosis not present

## 2019-12-27 DIAGNOSIS — M9903 Segmental and somatic dysfunction of lumbar region: Secondary | ICD-10-CM | POA: Diagnosis not present

## 2019-12-27 DIAGNOSIS — M9905 Segmental and somatic dysfunction of pelvic region: Secondary | ICD-10-CM | POA: Diagnosis not present

## 2019-12-27 DIAGNOSIS — M62830 Muscle spasm of back: Secondary | ICD-10-CM | POA: Diagnosis not present

## 2020-01-08 DIAGNOSIS — M545 Low back pain: Secondary | ICD-10-CM | POA: Diagnosis not present

## 2020-01-08 DIAGNOSIS — M9905 Segmental and somatic dysfunction of pelvic region: Secondary | ICD-10-CM | POA: Diagnosis not present

## 2020-01-08 DIAGNOSIS — M62830 Muscle spasm of back: Secondary | ICD-10-CM | POA: Diagnosis not present

## 2020-01-08 DIAGNOSIS — M9903 Segmental and somatic dysfunction of lumbar region: Secondary | ICD-10-CM | POA: Diagnosis not present

## 2020-01-16 DIAGNOSIS — R002 Palpitations: Secondary | ICD-10-CM | POA: Diagnosis not present

## 2020-01-18 DIAGNOSIS — R002 Palpitations: Secondary | ICD-10-CM | POA: Diagnosis not present

## 2020-02-15 DIAGNOSIS — Z1231 Encounter for screening mammogram for malignant neoplasm of breast: Secondary | ICD-10-CM | POA: Diagnosis not present

## 2020-04-08 DIAGNOSIS — R002 Palpitations: Secondary | ICD-10-CM | POA: Diagnosis not present

## 2020-04-18 DIAGNOSIS — Z20828 Contact with and (suspected) exposure to other viral communicable diseases: Secondary | ICD-10-CM | POA: Diagnosis not present

## 2020-05-02 DIAGNOSIS — R002 Palpitations: Secondary | ICD-10-CM | POA: Diagnosis not present

## 2020-05-08 DIAGNOSIS — I493 Ventricular premature depolarization: Secondary | ICD-10-CM | POA: Diagnosis not present

## 2020-05-08 DIAGNOSIS — I491 Atrial premature depolarization: Secondary | ICD-10-CM | POA: Diagnosis not present

## 2020-06-10 DIAGNOSIS — S134XXA Sprain of ligaments of cervical spine, initial encounter: Secondary | ICD-10-CM | POA: Diagnosis not present

## 2020-07-16 DIAGNOSIS — E782 Mixed hyperlipidemia: Secondary | ICD-10-CM | POA: Diagnosis not present

## 2020-07-16 DIAGNOSIS — F419 Anxiety disorder, unspecified: Secondary | ICD-10-CM | POA: Diagnosis not present

## 2020-07-16 DIAGNOSIS — Z131 Encounter for screening for diabetes mellitus: Secondary | ICD-10-CM | POA: Diagnosis not present

## 2020-07-16 DIAGNOSIS — I10 Essential (primary) hypertension: Secondary | ICD-10-CM | POA: Diagnosis not present

## 2020-07-16 DIAGNOSIS — Z79899 Other long term (current) drug therapy: Secondary | ICD-10-CM | POA: Diagnosis not present

## 2020-08-07 DIAGNOSIS — R7401 Elevation of levels of liver transaminase levels: Secondary | ICD-10-CM | POA: Diagnosis not present

## 2020-08-20 DIAGNOSIS — R748 Abnormal levels of other serum enzymes: Secondary | ICD-10-CM | POA: Diagnosis not present

## 2020-08-20 DIAGNOSIS — Z6831 Body mass index (BMI) 31.0-31.9, adult: Secondary | ICD-10-CM | POA: Diagnosis not present

## 2020-08-20 DIAGNOSIS — Z5320 Procedure and treatment not carried out because of patient's decision for unspecified reasons: Secondary | ICD-10-CM | POA: Diagnosis not present

## 2020-08-20 DIAGNOSIS — Z1159 Encounter for screening for other viral diseases: Secondary | ICD-10-CM | POA: Diagnosis not present

## 2020-11-05 DIAGNOSIS — N816 Rectocele: Secondary | ICD-10-CM | POA: Diagnosis not present

## 2020-11-05 DIAGNOSIS — N8182 Incompetence or weakening of pubocervical tissue: Secondary | ICD-10-CM | POA: Diagnosis not present

## 2020-11-05 DIAGNOSIS — N8189 Other female genital prolapse: Secondary | ICD-10-CM | POA: Diagnosis not present

## 2020-12-18 ENCOUNTER — Other Ambulatory Visit: Payer: Self-pay | Admitting: Obstetrics and Gynecology

## 2020-12-18 DIAGNOSIS — Z803 Family history of malignant neoplasm of breast: Secondary | ICD-10-CM

## 2020-12-19 DIAGNOSIS — M79645 Pain in left finger(s): Secondary | ICD-10-CM | POA: Diagnosis not present

## 2020-12-19 DIAGNOSIS — M13849 Other specified arthritis, unspecified hand: Secondary | ICD-10-CM | POA: Diagnosis not present

## 2021-01-08 DIAGNOSIS — L821 Other seborrheic keratosis: Secondary | ICD-10-CM | POA: Diagnosis not present

## 2021-01-08 DIAGNOSIS — L110 Acquired keratosis follicularis: Secondary | ICD-10-CM | POA: Diagnosis not present

## 2021-01-08 DIAGNOSIS — L718 Other rosacea: Secondary | ICD-10-CM | POA: Diagnosis not present

## 2021-01-08 DIAGNOSIS — D225 Melanocytic nevi of trunk: Secondary | ICD-10-CM | POA: Diagnosis not present

## 2021-01-14 DIAGNOSIS — N8189 Other female genital prolapse: Secondary | ICD-10-CM | POA: Diagnosis not present

## 2021-01-14 DIAGNOSIS — N816 Rectocele: Secondary | ICD-10-CM | POA: Diagnosis not present

## 2021-01-20 DIAGNOSIS — I10 Essential (primary) hypertension: Secondary | ICD-10-CM | POA: Diagnosis not present

## 2021-01-20 DIAGNOSIS — Z79899 Other long term (current) drug therapy: Secondary | ICD-10-CM | POA: Diagnosis not present

## 2021-01-20 DIAGNOSIS — R748 Abnormal levels of other serum enzymes: Secondary | ICD-10-CM | POA: Diagnosis not present

## 2021-01-20 DIAGNOSIS — M2550 Pain in unspecified joint: Secondary | ICD-10-CM | POA: Diagnosis not present

## 2021-01-20 DIAGNOSIS — R5383 Other fatigue: Secondary | ICD-10-CM | POA: Diagnosis not present

## 2021-01-20 DIAGNOSIS — F419 Anxiety disorder, unspecified: Secondary | ICD-10-CM | POA: Diagnosis not present

## 2021-02-02 ENCOUNTER — Ambulatory Visit
Admission: RE | Admit: 2021-02-02 | Discharge: 2021-02-02 | Disposition: A | Discharge status: Home / Self Care | Payer: BC Managed Care – PPO | Source: Ambulatory Visit | Attending: Obstetrics and Gynecology | Admitting: Obstetrics and Gynecology

## 2021-02-02 ENCOUNTER — Other Ambulatory Visit: Payer: Self-pay

## 2021-02-02 DIAGNOSIS — Z1239 Encounter for other screening for malignant neoplasm of breast: Secondary | ICD-10-CM | POA: Diagnosis not present

## 2021-02-02 DIAGNOSIS — Z803 Family history of malignant neoplasm of breast: Secondary | ICD-10-CM

## 2021-02-02 MED ORDER — GADOBUTROL 1 MMOL/ML IV SOLN
8.0000 mL | Freq: Once | INTRAVENOUS | Status: AC | PRN
  Administered 2021-02-02: 8 mL via INTRAVENOUS

## 2021-02-03 DIAGNOSIS — R7989 Other specified abnormal findings of blood chemistry: Secondary | ICD-10-CM | POA: Diagnosis not present

## 2021-02-05 DIAGNOSIS — Z6828 Body mass index (BMI) 28.0-28.9, adult: Secondary | ICD-10-CM | POA: Diagnosis not present

## 2021-02-05 DIAGNOSIS — Z01419 Encounter for gynecological examination (general) (routine) without abnormal findings: Secondary | ICD-10-CM | POA: Diagnosis not present

## 2021-02-18 ENCOUNTER — Telehealth: Payer: Self-pay | Admitting: Oncology

## 2021-02-18 NOTE — Telephone Encounter (Signed)
Patient referred by Dr Philemon Kingdom for Hemochromatosis.  Appt made 03/05/21 Labs 9:45 am - Consult 10:15 am

## 2021-02-24 DIAGNOSIS — Z1231 Encounter for screening mammogram for malignant neoplasm of breast: Secondary | ICD-10-CM | POA: Diagnosis not present

## 2021-03-04 ENCOUNTER — Other Ambulatory Visit: Payer: Self-pay | Admitting: Oncology

## 2021-03-04 NOTE — Progress Notes (Signed)
[facility]  [address] [place],  [place]  [address] [phone]  Clinic Day:  03/05/2021  Referring physician: Philemon Kingdom, MD   HISTORY OF PRESENT ILLNESS:  The patient is a 45 y.o. female  who I was asked to consult upon for hemochromatosis.   Recent labs showed an elevated ferritin of 330, but her serum iron was normal at 76, her TIBC was low at 244 and her iron saturation was normal at 31%.  However, hemochromatosis mutation testing showed her to only have 1 H63D mutation.  According to the patient, this workup was initiated after her CMP showed elevated liver enzymes.  She complains of sporadic fatigue, but denies having diffuse musculoskeletal symptoms.  To her knowledge, there is no family history of hemochromatosis.  PAST MEDICAL HISTORY:   Past Medical History:  Diagnosis Date   Anxiety    DDD (degenerative disc disease), cervical    Depression    Dizziness    Gait instability    Headache    Hypertension    Nystagmus     PAST SURGICAL HISTORY:  Vaginal prolapse repair  CURRENT MEDICATIONS:   Current Outpatient Medications  Medication Sig Dispense Refill   buPROPion (WELLBUTRIN XL) 150 MG 24 hr tablet bupropion HCl XL 150 mg 24 hr tablet, extended release  TAKE 2 TABLETS BY MOUTH EVERY MORNING.     Cholecalciferol (VITAMIN D3 PO) Take by mouth.     clonazePAM (KLONOPIN) 0.5 MG tablet Take 0.5 mg by mouth as needed. Once a day     cloNIDine (CATAPRES) 0.1 MG tablet Take 0.1 mg by mouth 2 (two) times daily.     cloNIDine (CATAPRES) 0.1 MG tablet Take by mouth.     Estradiol (ESTROGEL) 0.75 MG/1.25 GM (0.06%) topical gel EstroGel 1.25 gram/actuation (0.06%) transdermal gel pump  Apply 1 pump every day by topical route     gabapentin (NEURONTIN) 300 MG capsule Take 300 mg by mouth 3 (three) times daily. Take 2-4 caps at bedtime     Magnesium 300 MG CAPS Take 600 mg by mouth daily.     Multiple Vitamin (MULTIVITAMIN) tablet  Take 1 tablet by mouth daily.     Omega-3 Fatty Acids (FISH OIL PO) Take 1 capsule by mouth daily.     Phentermine HCl (LOMAIRA) 8 MG TABS Take by mouth.     progesterone (PROMETRIUM) 100 MG capsule Take 100 mg by mouth daily.     progesterone (PROMETRIUM) 100 MG capsule Take 200 mg by mouth at bedtime.     sertraline (ZOLOFT) 50 MG tablet Take 75 mg by mouth daily.  6   TURMERIC CURCUMIN PO Take by mouth.     UNABLE TO FIND Med Name: relora     No current facility-administered medications for this visit.    ALLERGIES:  No Known Allergies  FAMILY HISTORY:   Family History  Problem Relation Age of Onset   Diabetes Mother    Hypertension Mother    Diabetes Father    Hypertension Father    Prostate cancer Father    Healthy Sister    Breast cancer Maternal Grandmother    Prostate cancer Paternal Grandfather   2 paternal aunts with breast cancer and non Hodgkin lymphoma, respectively.  SOCIAL HISTORY:  The patient was born and raised in [place].  She lives in town with her husband of 20 years.  They have 3 children.  She has been a Teacher, early years/pre for 20 years.  There  is no history of alcoholism or tobacco abuse.  REVIEW OF SYSTEMS:  Review of Systems  Constitutional:  Positive for fatigue. Negative for fever.  HENT:   Negative for hearing loss and sore throat.   Eyes:  Positive for eye problems.  Respiratory:  Negative for chest tightness, cough and hemoptysis.   Cardiovascular:  Positive for palpitations. Negative for chest pain.  Gastrointestinal:  Negative for abdominal distention, abdominal pain, blood in stool, constipation, diarrhea, nausea and vomiting.  Endocrine: Negative for hot flashes.  Genitourinary:  Negative for difficulty urinating, dysuria, frequency, hematuria and nocturia.   Musculoskeletal:  Positive for arthralgias. Negative for back pain, gait problem and myalgias.  Skin: Negative.  Negative for itching and rash.  Neurological: Negative.  Negative for  dizziness, extremity weakness, gait problem, headaches, light-headedness and numbness.  Hematological: Negative.   Psychiatric/Behavioral:  Positive for depression. Negative for suicidal ideas. The patient is nervous/anxious.     PHYSICAL EXAM:  unknown if currently breastfeeding. Wt Readings from Last 3 Encounters:  11/21/18 209 lb 9.6 oz (95.1 kg)  09/08/15 263 lb (119.3 kg)   There is no height or weight on file to calculate BMI. Performance status (ECOG): 0 - Asymptomatic Physical Exam Constitutional:      Appearance: Normal appearance. She is not ill-appearing.  HENT:     Mouth/Throat:     Mouth: Mucous membranes are moist.     Pharynx: Oropharynx is clear. No oropharyngeal exudate or posterior oropharyngeal erythema.  Cardiovascular:     Rate and Rhythm: Normal rate and regular rhythm.     Heart sounds: No murmur heard.   No friction rub. No gallop.  Pulmonary:     Effort: Pulmonary effort is normal. No respiratory distress.     Breath sounds: Normal breath sounds. No wheezing, rhonchi or rales.  Abdominal:     General: Bowel sounds are normal. There is no distension.     Palpations: Abdomen is soft. There is no mass.     Tenderness: There is no abdominal tenderness.  Musculoskeletal:        General: No swelling.     Right lower leg: No edema.     Left lower leg: No edema.  Lymphadenopathy:     Cervical: No cervical adenopathy.     Upper Body:     Right upper body: No supraclavicular or axillary adenopathy.     Left upper body: No supraclavicular or axillary adenopathy.     Lower Body: No right inguinal adenopathy. No left inguinal adenopathy.  Skin:    General: Skin is warm.     Coloration: Skin is not jaundiced.     Findings: No lesion or rash.  Neurological:     General: No focal deficit present.     Mental Status: She is alert and oriented to person, place, and time. Mental status is at baseline.     Cranial Nerves: Cranial nerves are intact.  Psychiatric:         Mood and Affect: Mood normal.        Behavior: Behavior normal.        Thought Content: Thought content normal.   .phy  LABS:   CBC Latest Ref Rng & Units 03/05/2021 09/09/2015 09/08/2015  WBC - 7.7 12.6(H) 14.2(H)  Hemoglobin 12.0 - 16.0 14.2 11.2(L) 13.1  Hematocrit 36 - 46 42 33.7(L) 38.1  Platelets 150 - 399 271 225 267   CMP Latest Ref Rng & Units 03/05/2021 09/09/2015  Glucose 65 - 99  mg/dL - 87  BUN 4 - 21 13 17   Creatinine 0.5 - 1.1 1.1 0.82  Sodium 137 - 147 142 140  Potassium 3.4 - 5.3 4.2 4.2  Chloride 99 - 108 103 108  CO2 13 - 22 29(A) 24  Calcium 8.7 - 10.7 9.5 8.6(L)  Total Protein 6.5 - 8.1 g/dL - 5.7(L)  Total Bilirubin 0.3 - 1.2 mg/dL - )  Alkaline Phos 25 - 125 71 74  AST 13 - 35 25 25  ALT 7 - 35 29 16    Ref. Range 03/05/2021 09:50  Iron Latest Ref Range: 28 - 170 ug/dL 59  UIBC Latest Units: ug/dL 03/07/2021  TIBC Latest Ref Range: 250 - 450 ug/dL 676  Saturation Ratios Latest Ref Range: 10.4 - 31.8 % 20  Ferritin Latest Ref Range: 11 - 307 ng/mL 83    ASSESSMENT & PLAN:  A 45 y.o. female who I was asked to consult upon for an elevated ferritin level and possible hemochromatosis.  As mentioned previously, this patient only has 1 abnormal H63D gene, which is not the hemochromatosis mutation that is associated with severe iron overload (C282Y).  Her ferritin level, as well as the rest of his iron panel today, is completely normal.  Her previously elevated ferritin likely was representative of it being an acute phase reactant, more so than being related to iron overload from hemochromatosis.  The patient was also relieved to see that her liver enzymes have all normalized.  Overall, I do not get the sense any type of ominous hematologic process is present.  Based upon this, I do feel comfortable turning her care back over to her primary care office.  The patient understands all the plans discussed today and is in agreement with them.  I do appreciate 57, MD for his new consult.   Keawe Marcello Philemon Kingdom, MD

## 2021-03-05 ENCOUNTER — Other Ambulatory Visit: Payer: Self-pay

## 2021-03-05 ENCOUNTER — Inpatient Hospital Stay: Payer: BC Managed Care – PPO | Attending: Oncology

## 2021-03-05 ENCOUNTER — Other Ambulatory Visit: Payer: Self-pay | Admitting: Hematology and Oncology

## 2021-03-05 ENCOUNTER — Inpatient Hospital Stay (HOSPITAL_BASED_OUTPATIENT_CLINIC_OR_DEPARTMENT_OTHER): Payer: BC Managed Care – PPO | Admitting: Oncology

## 2021-03-05 DIAGNOSIS — R7989 Other specified abnormal findings of blood chemistry: Secondary | ICD-10-CM | POA: Insufficient documentation

## 2021-03-05 DIAGNOSIS — I10 Essential (primary) hypertension: Secondary | ICD-10-CM | POA: Diagnosis not present

## 2021-03-05 DIAGNOSIS — Z79899 Other long term (current) drug therapy: Secondary | ICD-10-CM | POA: Insufficient documentation

## 2021-03-05 LAB — HEPATIC FUNCTION PANEL
ALT: 29 (ref 7–35)
AST: 25 (ref 13–35)
Alkaline Phosphatase: 71 (ref 25–125)
Bilirubin, Total: 0.5

## 2021-03-05 LAB — IRON AND TIBC
Iron: 59 ug/dL (ref 28–170)
Saturation Ratios: 20 % (ref 10.4–31.8)
TIBC: 297 ug/dL (ref 250–450)
UIBC: 238 ug/dL

## 2021-03-05 LAB — BASIC METABOLIC PANEL
BUN: 13 (ref 4–21)
CO2: 29 — AB (ref 13–22)
Chloride: 103 (ref 99–108)
Creatinine: 1.1 (ref 0.5–1.1)
Glucose: 85
Potassium: 4.2 (ref 3.4–5.3)
Sodium: 142 (ref 137–147)

## 2021-03-05 LAB — COMPREHENSIVE METABOLIC PANEL
Albumin: 4.4 (ref 3.5–5.0)
Calcium: 9.5 (ref 8.7–10.7)

## 2021-03-05 LAB — CBC
MCV: 91 (ref 81–99)
RBC: 4.67 (ref 3.87–5.11)

## 2021-03-05 LAB — CBC AND DIFFERENTIAL
HCT: 42 (ref 36–46)
Hemoglobin: 14.2 (ref 12.0–16.0)
Neutrophils Absolute: 5.54
Platelets: 271 (ref 150–399)
WBC: 7.7

## 2021-03-05 LAB — FERRITIN: Ferritin: 83 ng/mL (ref 11–307)

## 2021-03-06 ENCOUNTER — Telehealth: Payer: Self-pay

## 2021-03-06 NOTE — Telephone Encounter (Signed)
I notified pt of Dr Melvyn Neth'  assessment and plan below. She verbalized understanding. ASSESSMENT & PLAN:  A 45 y.o. female who I was asked to consult upon for an elevated ferritin level and possible hemochromatosis.  As mentioned previously, this patient only has 1 abnormal H63D gene, which is not the hemochromatosis mutation that is associated with severe iron overload (C282Y).  Her ferritin level, as well as the rest of his iron panel today, is completely normal.  Her previously elevated ferritin likely was representative of it being an acute phase reactant, more so than being related to iron overload from hemochromatosis.  The patient was also relieved to see that her liver enzymes have all normalized.  Overall, I do not get the sense any type of ominous hematologic process is present.  Based upon this, I do feel comfortable turning her care back over to her primary care office.  The patient understands all the plans discussed today and is in agreement with them.   I do appreciate Philemon Kingdom, MD for his new consult.    Dequincy Kirby Funk, MD

## 2021-04-08 DIAGNOSIS — Z23 Encounter for immunization: Secondary | ICD-10-CM | POA: Diagnosis not present

## 2021-05-26 DIAGNOSIS — M19042 Primary osteoarthritis, left hand: Secondary | ICD-10-CM | POA: Diagnosis not present

## 2021-07-07 DIAGNOSIS — Z6828 Body mass index (BMI) 28.0-28.9, adult: Secondary | ICD-10-CM | POA: Diagnosis not present

## 2021-07-07 DIAGNOSIS — Z0000 Encounter for general adult medical examination without abnormal findings: Secondary | ICD-10-CM | POA: Diagnosis not present

## 2021-07-07 DIAGNOSIS — Z1331 Encounter for screening for depression: Secondary | ICD-10-CM | POA: Diagnosis not present

## 2021-08-12 DIAGNOSIS — Z6838 Body mass index (BMI) 38.0-38.9, adult: Secondary | ICD-10-CM | POA: Diagnosis not present

## 2021-08-12 DIAGNOSIS — N951 Menopausal and female climacteric states: Secondary | ICD-10-CM | POA: Diagnosis not present

## 2021-08-12 DIAGNOSIS — R635 Abnormal weight gain: Secondary | ICD-10-CM | POA: Diagnosis not present

## 2021-08-12 DIAGNOSIS — E559 Vitamin D deficiency, unspecified: Secondary | ICD-10-CM | POA: Diagnosis not present

## 2021-08-17 DIAGNOSIS — Z1339 Encounter for screening examination for other mental health and behavioral disorders: Secondary | ICD-10-CM | POA: Diagnosis not present

## 2021-08-17 DIAGNOSIS — Z1331 Encounter for screening for depression: Secondary | ICD-10-CM | POA: Diagnosis not present

## 2021-08-17 DIAGNOSIS — Z6838 Body mass index (BMI) 38.0-38.9, adult: Secondary | ICD-10-CM | POA: Diagnosis not present

## 2021-08-17 DIAGNOSIS — N951 Menopausal and female climacteric states: Secondary | ICD-10-CM | POA: Diagnosis not present

## 2021-08-17 DIAGNOSIS — R635 Abnormal weight gain: Secondary | ICD-10-CM | POA: Diagnosis not present

## 2021-08-17 DIAGNOSIS — F329 Major depressive disorder, single episode, unspecified: Secondary | ICD-10-CM | POA: Diagnosis not present

## 2021-10-14 DIAGNOSIS — E559 Vitamin D deficiency, unspecified: Secondary | ICD-10-CM | POA: Diagnosis not present

## 2021-10-14 DIAGNOSIS — E538 Deficiency of other specified B group vitamins: Secondary | ICD-10-CM | POA: Diagnosis not present

## 2021-10-14 DIAGNOSIS — R5383 Other fatigue: Secondary | ICD-10-CM | POA: Diagnosis not present

## 2021-10-14 DIAGNOSIS — Z1329 Encounter for screening for other suspected endocrine disorder: Secondary | ICD-10-CM | POA: Diagnosis not present

## 2021-10-14 DIAGNOSIS — Z131 Encounter for screening for diabetes mellitus: Secondary | ICD-10-CM | POA: Diagnosis not present

## 2021-10-14 DIAGNOSIS — R6882 Decreased libido: Secondary | ICD-10-CM | POA: Diagnosis not present

## 2021-10-14 DIAGNOSIS — M1990 Unspecified osteoarthritis, unspecified site: Secondary | ICD-10-CM | POA: Diagnosis not present

## 2021-10-14 DIAGNOSIS — N951 Menopausal and female climacteric states: Secondary | ICD-10-CM | POA: Diagnosis not present

## 2021-10-19 DIAGNOSIS — Z6838 Body mass index (BMI) 38.0-38.9, adult: Secondary | ICD-10-CM | POA: Diagnosis not present

## 2021-10-19 DIAGNOSIS — E559 Vitamin D deficiency, unspecified: Secondary | ICD-10-CM | POA: Diagnosis not present

## 2021-11-23 DIAGNOSIS — R5383 Other fatigue: Secondary | ICD-10-CM | POA: Diagnosis not present

## 2021-11-23 DIAGNOSIS — F419 Anxiety disorder, unspecified: Secondary | ICD-10-CM | POA: Diagnosis not present

## 2021-11-23 DIAGNOSIS — R6882 Decreased libido: Secondary | ICD-10-CM | POA: Diagnosis not present

## 2021-11-23 DIAGNOSIS — M1990 Unspecified osteoarthritis, unspecified site: Secondary | ICD-10-CM | POA: Diagnosis not present

## 2021-12-29 DIAGNOSIS — M19049 Primary osteoarthritis, unspecified hand: Secondary | ICD-10-CM | POA: Diagnosis not present

## 2021-12-29 DIAGNOSIS — Z6829 Body mass index (BMI) 29.0-29.9, adult: Secondary | ICD-10-CM | POA: Diagnosis not present

## 2022-01-11 DIAGNOSIS — R5382 Chronic fatigue, unspecified: Secondary | ICD-10-CM | POA: Diagnosis not present

## 2022-01-11 DIAGNOSIS — N951 Menopausal and female climacteric states: Secondary | ICD-10-CM | POA: Diagnosis not present

## 2022-01-14 DIAGNOSIS — R232 Flushing: Secondary | ICD-10-CM | POA: Diagnosis not present

## 2022-01-14 DIAGNOSIS — Z6839 Body mass index (BMI) 39.0-39.9, adult: Secondary | ICD-10-CM | POA: Diagnosis not present

## 2022-01-14 DIAGNOSIS — R5382 Chronic fatigue, unspecified: Secondary | ICD-10-CM | POA: Diagnosis not present

## 2022-01-14 DIAGNOSIS — F5101 Primary insomnia: Secondary | ICD-10-CM | POA: Diagnosis not present

## 2022-01-26 DIAGNOSIS — L718 Other rosacea: Secondary | ICD-10-CM | POA: Diagnosis not present

## 2022-01-26 DIAGNOSIS — L858 Other specified epidermal thickening: Secondary | ICD-10-CM | POA: Diagnosis not present

## 2022-01-28 DIAGNOSIS — F419 Anxiety disorder, unspecified: Secondary | ICD-10-CM | POA: Diagnosis not present

## 2022-01-28 DIAGNOSIS — N951 Menopausal and female climacteric states: Secondary | ICD-10-CM | POA: Diagnosis not present

## 2022-01-28 DIAGNOSIS — R5383 Other fatigue: Secondary | ICD-10-CM | POA: Diagnosis not present

## 2022-01-28 DIAGNOSIS — M1990 Unspecified osteoarthritis, unspecified site: Secondary | ICD-10-CM | POA: Diagnosis not present

## 2022-02-24 DIAGNOSIS — R6882 Decreased libido: Secondary | ICD-10-CM | POA: Diagnosis not present

## 2022-02-24 DIAGNOSIS — F419 Anxiety disorder, unspecified: Secondary | ICD-10-CM | POA: Diagnosis not present

## 2022-02-24 DIAGNOSIS — R5383 Other fatigue: Secondary | ICD-10-CM | POA: Diagnosis not present

## 2022-02-24 DIAGNOSIS — M1990 Unspecified osteoarthritis, unspecified site: Secondary | ICD-10-CM | POA: Diagnosis not present

## 2022-03-03 DIAGNOSIS — M13849 Other specified arthritis, unspecified hand: Secondary | ICD-10-CM | POA: Diagnosis not present

## 2022-03-03 DIAGNOSIS — M79645 Pain in left finger(s): Secondary | ICD-10-CM | POA: Diagnosis not present

## 2022-04-14 DIAGNOSIS — Z23 Encounter for immunization: Secondary | ICD-10-CM | POA: Diagnosis not present

## 2022-05-03 DIAGNOSIS — H52203 Unspecified astigmatism, bilateral: Secondary | ICD-10-CM | POA: Diagnosis not present

## 2022-05-03 DIAGNOSIS — H524 Presbyopia: Secondary | ICD-10-CM | POA: Diagnosis not present

## 2022-05-03 DIAGNOSIS — H04123 Dry eye syndrome of bilateral lacrimal glands: Secondary | ICD-10-CM | POA: Diagnosis not present

## 2022-06-08 DIAGNOSIS — M19041 Primary osteoarthritis, right hand: Secondary | ICD-10-CM | POA: Diagnosis not present

## 2022-06-08 DIAGNOSIS — Z79899 Other long term (current) drug therapy: Secondary | ICD-10-CM | POA: Diagnosis not present

## 2022-06-08 DIAGNOSIS — M19042 Primary osteoarthritis, left hand: Secondary | ICD-10-CM | POA: Diagnosis not present

## 2022-06-08 DIAGNOSIS — M1990 Unspecified osteoarthritis, unspecified site: Secondary | ICD-10-CM | POA: Diagnosis not present

## 2022-06-08 DIAGNOSIS — M170 Bilateral primary osteoarthritis of knee: Secondary | ICD-10-CM | POA: Diagnosis not present

## 2022-06-15 DIAGNOSIS — Z1231 Encounter for screening mammogram for malignant neoplasm of breast: Secondary | ICD-10-CM | POA: Diagnosis not present

## 2022-06-15 DIAGNOSIS — Z6830 Body mass index (BMI) 30.0-30.9, adult: Secondary | ICD-10-CM | POA: Diagnosis not present

## 2022-06-15 DIAGNOSIS — Z01419 Encounter for gynecological examination (general) (routine) without abnormal findings: Secondary | ICD-10-CM | POA: Diagnosis not present

## 2022-06-16 ENCOUNTER — Other Ambulatory Visit: Payer: Self-pay | Admitting: Obstetrics and Gynecology

## 2022-06-16 DIAGNOSIS — R928 Other abnormal and inconclusive findings on diagnostic imaging of breast: Secondary | ICD-10-CM

## 2022-06-23 ENCOUNTER — Ambulatory Visit
Admission: RE | Admit: 2022-06-23 | Discharge: 2022-06-23 | Disposition: A | Discharge status: Home / Self Care | Payer: BC Managed Care – PPO | Source: Ambulatory Visit | Attending: Obstetrics and Gynecology | Admitting: Obstetrics and Gynecology

## 2022-06-23 DIAGNOSIS — N6489 Other specified disorders of breast: Secondary | ICD-10-CM | POA: Diagnosis not present

## 2022-06-23 DIAGNOSIS — R928 Other abnormal and inconclusive findings on diagnostic imaging of breast: Secondary | ICD-10-CM | POA: Diagnosis not present

## 2022-06-29 DIAGNOSIS — E669 Obesity, unspecified: Secondary | ICD-10-CM | POA: Diagnosis not present

## 2022-06-29 DIAGNOSIS — F419 Anxiety disorder, unspecified: Secondary | ICD-10-CM | POA: Diagnosis not present

## 2022-06-29 DIAGNOSIS — Z79899 Other long term (current) drug therapy: Secondary | ICD-10-CM | POA: Diagnosis not present

## 2022-06-30 ENCOUNTER — Other Ambulatory Visit: Payer: BC Managed Care – PPO

## 2022-07-28 DIAGNOSIS — Z79899 Other long term (current) drug therapy: Secondary | ICD-10-CM | POA: Diagnosis not present

## 2022-10-25 DIAGNOSIS — R197 Diarrhea, unspecified: Secondary | ICD-10-CM | POA: Diagnosis not present

## 2022-10-26 DIAGNOSIS — Z6828 Body mass index (BMI) 28.0-28.9, adult: Secondary | ICD-10-CM | POA: Diagnosis not present

## 2022-10-26 DIAGNOSIS — R748 Abnormal levels of other serum enzymes: Secondary | ICD-10-CM | POA: Diagnosis not present

## 2022-10-26 DIAGNOSIS — R21 Rash and other nonspecific skin eruption: Secondary | ICD-10-CM | POA: Diagnosis not present

## 2022-10-26 DIAGNOSIS — R509 Fever, unspecified: Secondary | ICD-10-CM | POA: Diagnosis not present

## 2022-10-27 DIAGNOSIS — R21 Rash and other nonspecific skin eruption: Secondary | ICD-10-CM | POA: Diagnosis not present

## 2022-10-27 DIAGNOSIS — R509 Fever, unspecified: Secondary | ICD-10-CM | POA: Diagnosis not present

## 2022-10-27 DIAGNOSIS — R748 Abnormal levels of other serum enzymes: Secondary | ICD-10-CM | POA: Diagnosis not present

## 2022-11-05 IMAGING — MR MR BREAST BILAT WO/W CM
8 of 12 series · 33 of 48 positions shown · IV contrast (8ml gadavist)
Comparison: Previous exam(s).

CLINICAL DATA: 45-year-old female presenting for high risk
screening MRI.

LABS:  None performed on site.
EXAM:
BILATERAL BREAST MRI WITH AND WITHOUT CONTRAST
TECHNIQUE: Multiplanar, multisequence MR images of both breasts were obtained
prior to and following the intravenous administration of 8 ml of
Gadavist.

[Series 3: t2_tirm_tra ipat (a-p) · axial · 3.0mm · 0.70mm/px · 1 of 55 slices shown]
[im 1/55]
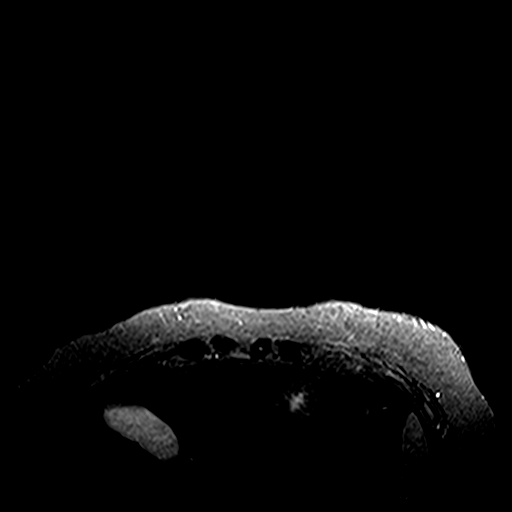

[Series 5: fl3d pre-cm no · axial · non-contrast · 1.2mm · 0.94mm/px · z∈[-60,+112]mm · 5 of 144 slices shown]
[im 1/144]
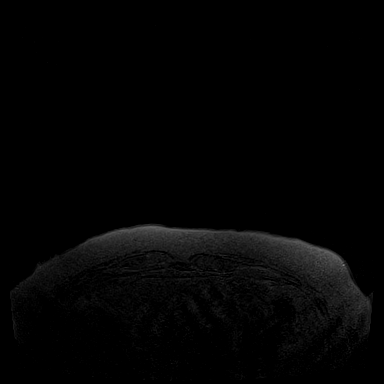
[im 36/144]
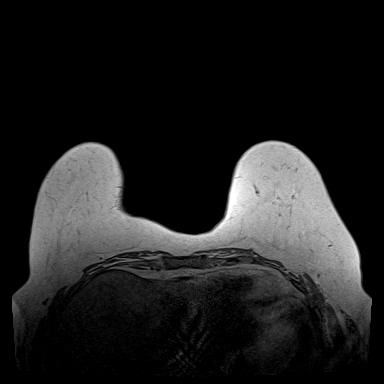
[im 72/144]
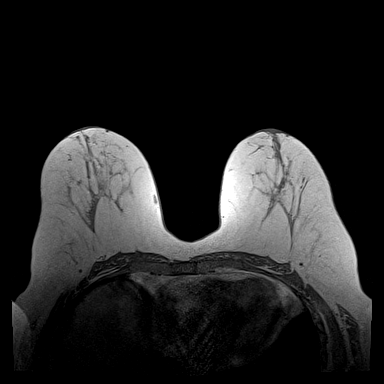
[im 108/144]
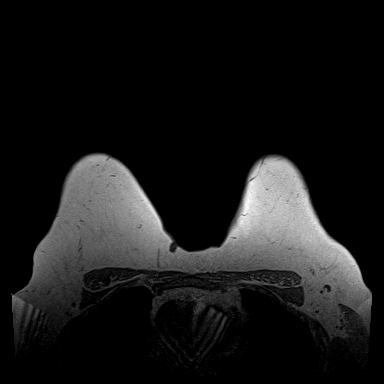
[im 144/144]
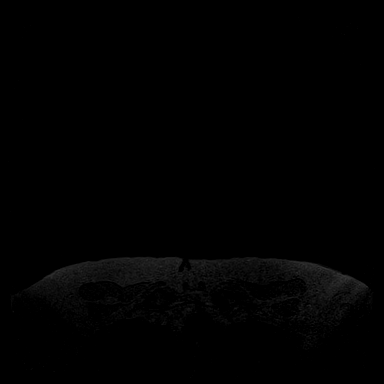

[Series 6: fl3d pre-cm · axial · non-contrast · 1.2mm · 0.94mm/px · z∈[-60,+112]mm · 5 of 144 slices shown]
[im 1/144]
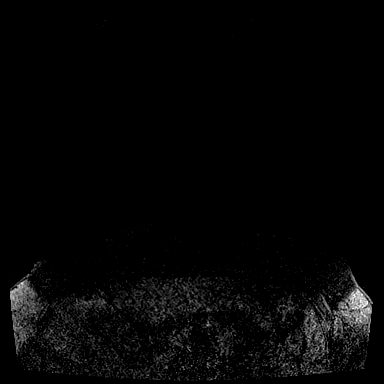
[im 36/144]
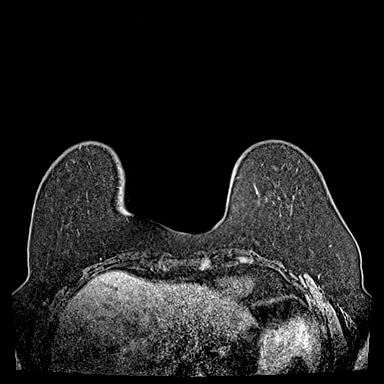
[im 72/144]
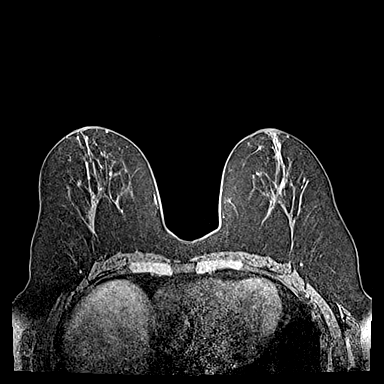
[im 108/144]
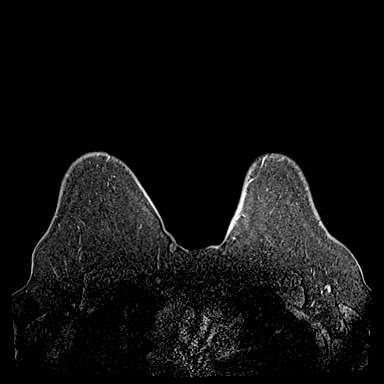
[im 144/144]
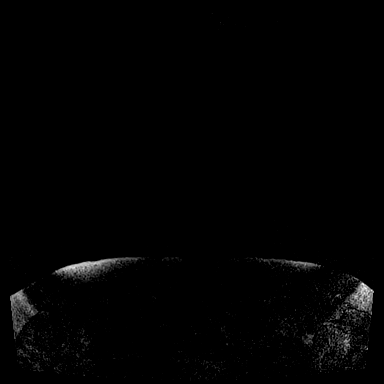

[Series 7: fl3d post-cm 20 · axial · 1.2mm · 0.94mm/px · z∈[-60,+112]mm · 5 of 144 slices shown (1 of 3)]
[im 1/144]
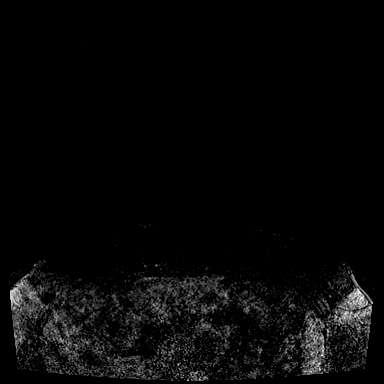
[im 36/144]
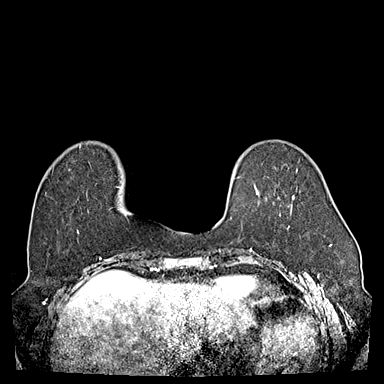
[im 72/144]
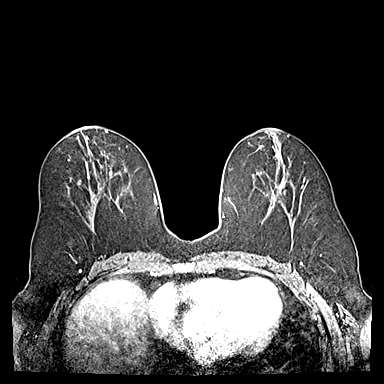
[im 108/144]
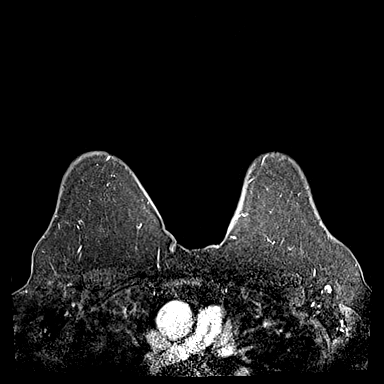
[im 144/144]
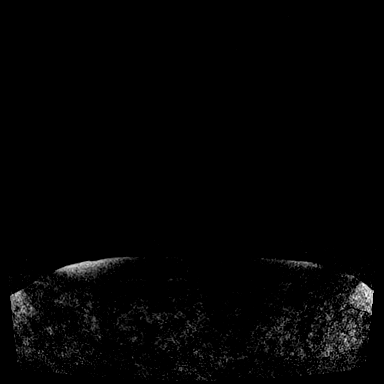

[Series 8: fl3d post-cm 20 · axial · 1.2mm · 0.94mm/px · z∈[-60,+112]mm · 5 of 144 slices shown (2 of 3)]
[im 1/144]
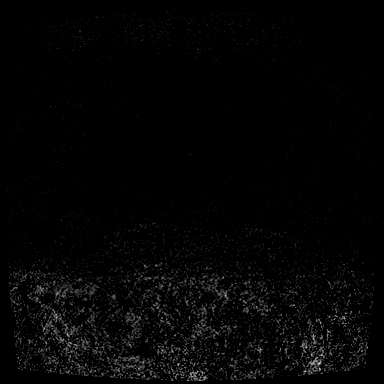
[im 36/144]
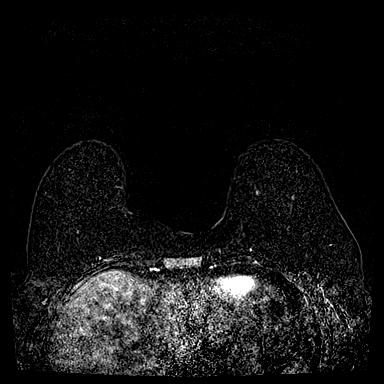
[im 72/144]
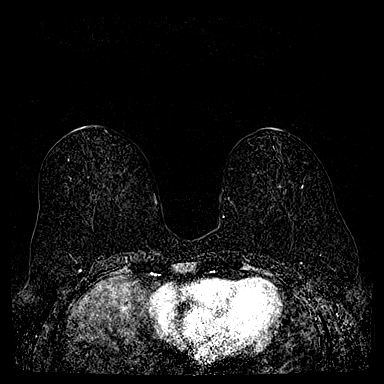
[im 108/144]
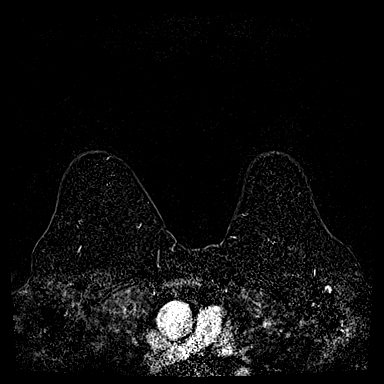
[im 144/144]
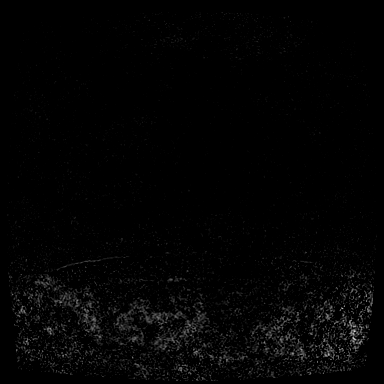

[Series 9: fl3d post-cm 20 · axial · 172.8mm · 0.94mm/px · 1 of 1 slices shown (3 of 3)]
[im 1/1]
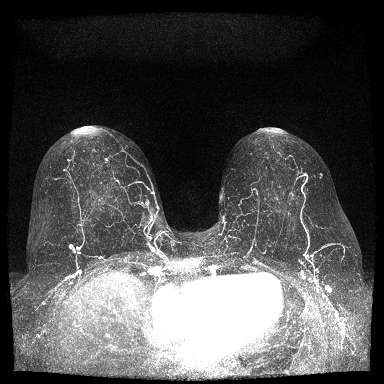

[Series 10: fl3d post-cm 3 · axial · 1.2mm · 0.94mm/px · z∈[-60,+112]mm · 6 of 144 slices shown (1 of 2)]
[im 1/144]
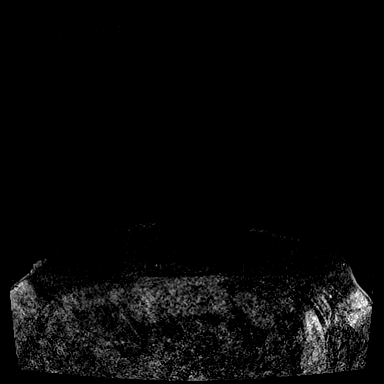
[im 29/144]
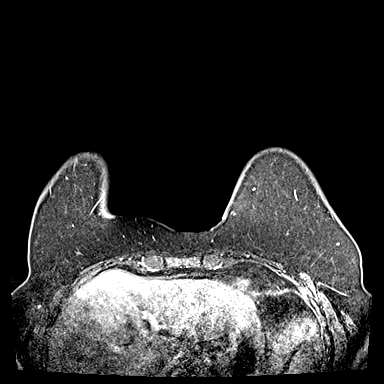
[im 58/144]
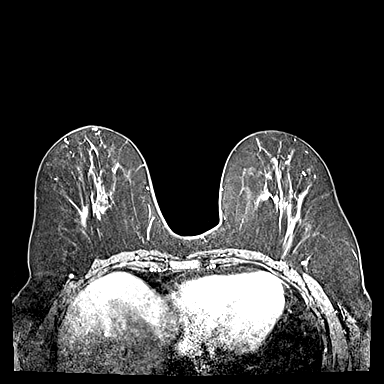
[im 86/144]
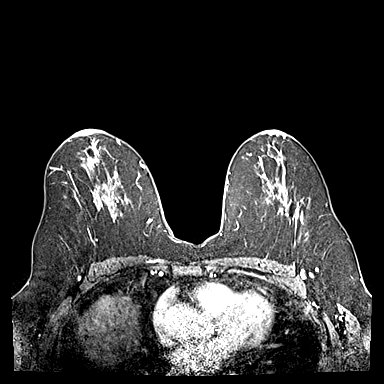
[im 115/144]
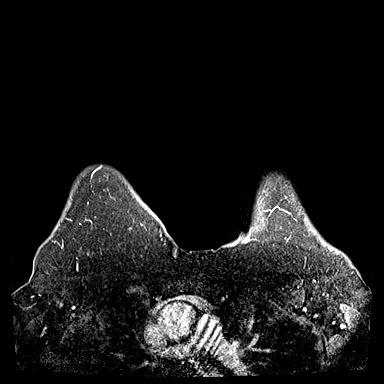
[im 144/144]
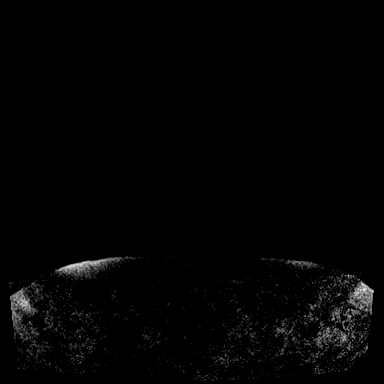

[Series 11: fl3d post-cm 3 · axial · 1.2mm · 0.94mm/px · z∈[-60,+77]mm · 5 of 144 slices shown (2 of 2)]
[im 1/144]
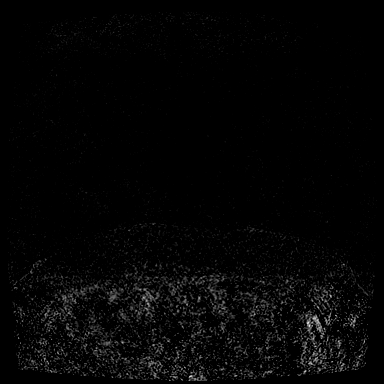
[im 29/144]
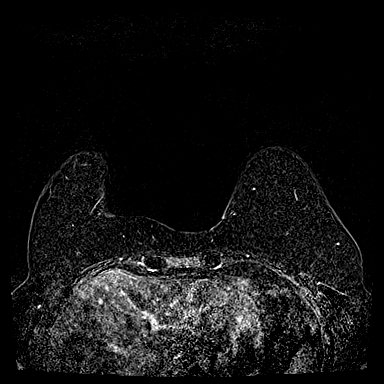
[im 58/144]
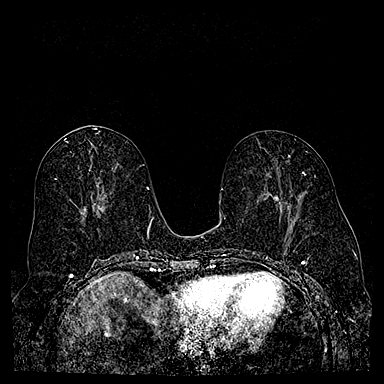
[im 86/144]
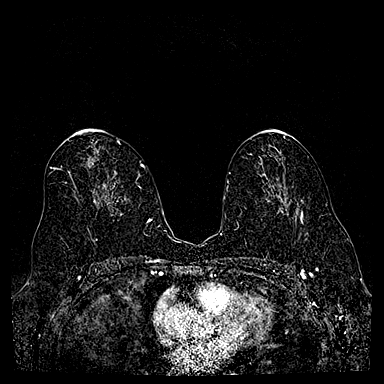
[im 115/144]
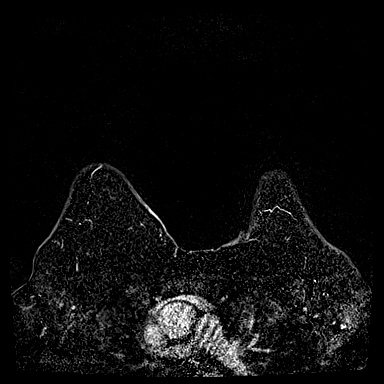

[33 of 48 positions shown; findings below may reference images not displayed]

Three-dimensional MR images were rendered by post-processing of the
original MR data on an independent workstation. The
three-dimensional MR images were interpreted, and findings are
reported in the following complete MRI report for this study. Three
dimensional images were evaluated at the independent interpreting
workstation using the [employer] thin client.
FINDINGS: Breast composition: b. Scattered fibroglandular tissue.

Background parenchymal enhancement: Mild.

Right breast: No suspicious mass or abnormal enhancement.

Left breast: No suspicious mass or abnormal enhancement.

Lymph nodes: No abnormal appearing lymph nodes.

Ancillary findings:  None.
IMPRESSION: No MRI evidence of malignancy in either breast.

RECOMMENDATION:
Routine annual screening with mammography and breast MRI. The
patient is due for her next screening mammogram in February 2021.

BI-RADS CATEGORY  1: Negative.

## 2022-11-23 DIAGNOSIS — M159 Polyosteoarthritis, unspecified: Secondary | ICD-10-CM | POA: Diagnosis not present

## 2022-11-23 DIAGNOSIS — Z79899 Other long term (current) drug therapy: Secondary | ICD-10-CM | POA: Diagnosis not present

## 2022-11-24 DIAGNOSIS — R748 Abnormal levels of other serum enzymes: Secondary | ICD-10-CM | POA: Diagnosis not present

## 2022-11-24 DIAGNOSIS — B343 Parvovirus infection, unspecified: Secondary | ICD-10-CM | POA: Diagnosis not present

## 2023-03-17 DIAGNOSIS — R5383 Other fatigue: Secondary | ICD-10-CM | POA: Diagnosis not present

## 2023-03-17 DIAGNOSIS — Z1331 Encounter for screening for depression: Secondary | ICD-10-CM | POA: Diagnosis not present

## 2023-03-17 DIAGNOSIS — Z0000 Encounter for general adult medical examination without abnormal findings: Secondary | ICD-10-CM | POA: Diagnosis not present

## 2023-03-17 DIAGNOSIS — Z6827 Body mass index (BMI) 27.0-27.9, adult: Secondary | ICD-10-CM | POA: Diagnosis not present

## 2023-04-20 DIAGNOSIS — R7989 Other specified abnormal findings of blood chemistry: Secondary | ICD-10-CM | POA: Diagnosis not present

## 2023-04-27 ENCOUNTER — Other Ambulatory Visit: Payer: Self-pay | Admitting: Obstetrics and Gynecology

## 2023-04-27 DIAGNOSIS — Z803 Family history of malignant neoplasm of breast: Secondary | ICD-10-CM

## 2023-05-18 DIAGNOSIS — Z23 Encounter for immunization: Secondary | ICD-10-CM | POA: Diagnosis not present

## 2023-06-28 ENCOUNTER — Ambulatory Visit
Admission: RE | Admit: 2023-06-28 | Discharge: 2023-06-28 | Disposition: A | Discharge status: Home / Self Care | Payer: BC Managed Care – PPO | Source: Ambulatory Visit | Attending: Obstetrics and Gynecology | Admitting: Obstetrics and Gynecology

## 2023-06-28 DIAGNOSIS — Z803 Family history of malignant neoplasm of breast: Secondary | ICD-10-CM

## 2023-06-28 DIAGNOSIS — Z1239 Encounter for other screening for malignant neoplasm of breast: Secondary | ICD-10-CM | POA: Diagnosis not present

## 2023-06-28 MED ORDER — GADOPICLENOL 0.5 MMOL/ML IV SOLN
8.0000 mL | Freq: Once | INTRAVENOUS | Status: AC | PRN
  Administered 2023-06-28: 8 mL via INTRAVENOUS

## 2023-07-14 DIAGNOSIS — L821 Other seborrheic keratosis: Secondary | ICD-10-CM | POA: Diagnosis not present

## 2023-07-14 DIAGNOSIS — L718 Other rosacea: Secondary | ICD-10-CM | POA: Diagnosis not present

## 2023-07-14 DIAGNOSIS — D1801 Hemangioma of skin and subcutaneous tissue: Secondary | ICD-10-CM | POA: Diagnosis not present

## 2023-07-19 DIAGNOSIS — Z01419 Encounter for gynecological examination (general) (routine) without abnormal findings: Secondary | ICD-10-CM | POA: Diagnosis not present

## 2023-07-19 DIAGNOSIS — Z1231 Encounter for screening mammogram for malignant neoplasm of breast: Secondary | ICD-10-CM | POA: Diagnosis not present

## 2023-07-19 DIAGNOSIS — N951 Menopausal and female climacteric states: Secondary | ICD-10-CM | POA: Diagnosis not present

## 2023-07-19 DIAGNOSIS — Z124 Encounter for screening for malignant neoplasm of cervix: Secondary | ICD-10-CM | POA: Diagnosis not present

## 2023-07-19 DIAGNOSIS — Z6825 Body mass index (BMI) 25.0-25.9, adult: Secondary | ICD-10-CM | POA: Diagnosis not present

## 2023-07-21 DIAGNOSIS — R7989 Other specified abnormal findings of blood chemistry: Secondary | ICD-10-CM | POA: Diagnosis not present

## 2023-07-22 ENCOUNTER — Other Ambulatory Visit: Payer: Self-pay | Admitting: Obstetrics and Gynecology

## 2023-07-22 DIAGNOSIS — R928 Other abnormal and inconclusive findings on diagnostic imaging of breast: Secondary | ICD-10-CM

## 2023-07-25 ENCOUNTER — Ambulatory Visit
Admission: RE | Admit: 2023-07-25 | Discharge: 2023-07-25 | Disposition: A | Discharge status: Home / Self Care | Payer: BC Managed Care – PPO | Source: Ambulatory Visit | Attending: Obstetrics and Gynecology | Admitting: Obstetrics and Gynecology

## 2023-07-25 DIAGNOSIS — R928 Other abnormal and inconclusive findings on diagnostic imaging of breast: Secondary | ICD-10-CM

## 2023-07-25 DIAGNOSIS — N6002 Solitary cyst of left breast: Secondary | ICD-10-CM | POA: Diagnosis not present

## 2023-10-04 DIAGNOSIS — B029 Zoster without complications: Secondary | ICD-10-CM | POA: Diagnosis not present

## 2023-10-04 DIAGNOSIS — Z6825 Body mass index (BMI) 25.0-25.9, adult: Secondary | ICD-10-CM | POA: Diagnosis not present

## 2023-11-08 DIAGNOSIS — R42 Dizziness and giddiness: Secondary | ICD-10-CM | POA: Diagnosis not present

## 2023-11-08 DIAGNOSIS — H938X3 Other specified disorders of ear, bilateral: Secondary | ICD-10-CM | POA: Diagnosis not present

## 2023-11-08 DIAGNOSIS — H93293 Other abnormal auditory perceptions, bilateral: Secondary | ICD-10-CM | POA: Diagnosis not present

## 2023-12-06 DIAGNOSIS — H938X3 Other specified disorders of ear, bilateral: Secondary | ICD-10-CM | POA: Diagnosis not present

## 2024-03-28 DIAGNOSIS — H04123 Dry eye syndrome of bilateral lacrimal glands: Secondary | ICD-10-CM | POA: Diagnosis not present

## 2024-04-09 DIAGNOSIS — Z1331 Encounter for screening for depression: Secondary | ICD-10-CM | POA: Diagnosis not present

## 2024-04-09 DIAGNOSIS — N951 Menopausal and female climacteric states: Secondary | ICD-10-CM | POA: Diagnosis not present

## 2024-04-09 DIAGNOSIS — Z6825 Body mass index (BMI) 25.0-25.9, adult: Secondary | ICD-10-CM | POA: Diagnosis not present

## 2024-04-09 DIAGNOSIS — Z0000 Encounter for general adult medical examination without abnormal findings: Secondary | ICD-10-CM | POA: Diagnosis not present

## 2024-04-11 DIAGNOSIS — M6281 Muscle weakness (generalized): Secondary | ICD-10-CM | POA: Diagnosis not present

## 2024-04-11 DIAGNOSIS — E781 Pure hyperglyceridemia: Secondary | ICD-10-CM | POA: Diagnosis not present

## 2024-04-11 DIAGNOSIS — N951 Menopausal and female climacteric states: Secondary | ICD-10-CM | POA: Diagnosis not present

## 2024-04-11 DIAGNOSIS — R7989 Other specified abnormal findings of blood chemistry: Secondary | ICD-10-CM | POA: Diagnosis not present

## 2024-04-16 DIAGNOSIS — L564 Polymorphous light eruption: Secondary | ICD-10-CM | POA: Diagnosis not present

## 2024-04-16 DIAGNOSIS — L719 Rosacea, unspecified: Secondary | ICD-10-CM | POA: Diagnosis not present

## 2024-04-23 DIAGNOSIS — M2550 Pain in unspecified joint: Secondary | ICD-10-CM | POA: Diagnosis not present

## 2024-05-01 ENCOUNTER — Other Ambulatory Visit: Payer: Self-pay | Admitting: Obstetrics and Gynecology

## 2024-05-01 DIAGNOSIS — Z803 Family history of malignant neoplasm of breast: Secondary | ICD-10-CM

## 2024-06-19 ENCOUNTER — Other Ambulatory Visit

## 2024-07-15 ENCOUNTER — Other Ambulatory Visit
